# Patient Record
Sex: Male | Born: 1975 | Race: Black or African American | Hispanic: No | Marital: Married | Smoking: Former smoker
Health system: Southern US, Community
[De-identification: ages and names within clinical notes are randomized; demographics above are authoritative.]

## PROBLEM LIST (undated history)

## (undated) DIAGNOSIS — K219 Gastro-esophageal reflux disease without esophagitis: Secondary | ICD-10-CM

---

## 2006-12-22 ENCOUNTER — Emergency Department (HOSPITAL_COMMUNITY): Admission: EM | Admit: 2006-12-22 | Discharge: 2006-12-22 | Payer: Self-pay | Admitting: Emergency Medicine

## 2009-01-25 ENCOUNTER — Emergency Department (HOSPITAL_COMMUNITY): Admission: EM | Admit: 2009-01-25 | Discharge: 2009-01-25 | Payer: Self-pay | Admitting: Family Medicine

## 2009-02-12 ENCOUNTER — Encounter (INDEPENDENT_AMBULATORY_CARE_PROVIDER_SITE_OTHER): Payer: Self-pay | Admitting: *Deleted

## 2009-02-12 ENCOUNTER — Ambulatory Visit: Payer: Self-pay | Admitting: Sports Medicine

## 2009-04-23 ENCOUNTER — Emergency Department (HOSPITAL_COMMUNITY): Admission: EM | Admit: 2009-04-23 | Discharge: 2009-04-23 | Payer: Self-pay | Admitting: Emergency Medicine

## 2010-06-22 ENCOUNTER — Encounter: Payer: Self-pay | Admitting: *Deleted

## 2010-12-18 ENCOUNTER — Inpatient Hospital Stay (INDEPENDENT_AMBULATORY_CARE_PROVIDER_SITE_OTHER)
Admission: RE | Admit: 2010-12-18 | Discharge: 2010-12-18 | Disposition: A | Discharge status: Home / Self Care | Payer: Self-pay | Source: Ambulatory Visit | Attending: Emergency Medicine | Admitting: Emergency Medicine

## 2010-12-18 DIAGNOSIS — H109 Unspecified conjunctivitis: Secondary | ICD-10-CM

## 2013-01-17 ENCOUNTER — Emergency Department (INDEPENDENT_AMBULATORY_CARE_PROVIDER_SITE_OTHER)
Admission: EM | Admit: 2013-01-17 | Discharge: 2013-01-17 | Disposition: A | Discharge status: Home / Self Care | Payer: Self-pay | Source: Home / Self Care | Attending: Emergency Medicine | Admitting: Emergency Medicine

## 2013-01-17 ENCOUNTER — Encounter (HOSPITAL_COMMUNITY): Payer: Self-pay | Admitting: Emergency Medicine

## 2013-01-17 DIAGNOSIS — K529 Noninfective gastroenteritis and colitis, unspecified: Secondary | ICD-10-CM

## 2013-01-17 DIAGNOSIS — K5289 Other specified noninfective gastroenteritis and colitis: Secondary | ICD-10-CM

## 2013-01-17 LAB — POCT I-STAT, CHEM 8
HCT: 45 % (ref 39.0–52.0)
Hemoglobin: 15.3 g/dL (ref 13.0–17.0)
Sodium: 140 mEq/L (ref 135–145)
TCO2: 25 mmol/L (ref 0–100)

## 2013-01-17 MED ORDER — OMEPRAZOLE 20 MG PO CPDR: 20.0000 mg | DELAYED_RELEASE_CAPSULE | Freq: Every day | ORAL | Status: DC

## 2013-01-17 MED ORDER — GI COCKTAIL ~~LOC~~
30.0000 mL | Freq: Once | ORAL | Status: AC
  Administered 2013-01-17: 30 mL via ORAL

## 2013-01-17 MED ORDER — CIPROFLOXACIN HCL 500 MG PO TABS: 500.0000 mg | ORAL_TABLET | Freq: Two times a day (BID) | ORAL | Status: DC

## 2013-01-17 MED ORDER — ONDANSETRON 4 MG PO TBDP
8.0000 mg | ORAL_TABLET | Freq: Once | ORAL | Status: AC
  Administered 2013-01-17: 8 mg via ORAL

## 2013-01-17 MED ORDER — ONDANSETRON 8 MG PO TBDP: 8.0000 mg | ORAL_TABLET | Freq: Three times a day (TID) | ORAL | Status: DC | PRN

## 2013-01-17 MED ORDER — GI COCKTAIL ~~LOC~~
ORAL | Status: AC
  Filled 2013-01-17: qty 30

## 2013-01-17 MED ORDER — ONDANSETRON 4 MG PO TBDP
ORAL_TABLET | ORAL | Status: AC
  Filled 2013-01-17: qty 2

## 2013-01-17 NOTE — ED Notes (Signed)
Pt  Reports   Symptoms  Of   Low  abd pan   With  Decreased  Appetite  Symptoms  Began    Began  About  5  Days  Ago         Had  Some vomiting  Earlier  The vomiting  Stopped   sev  Days  Ago           He  Reports    Decreased  Appetite  As    Well       = he  States  He  Recently retutrned  From [facility]      But  States  He had symptoms  Prior to  Going  There

## 2013-01-17 NOTE — ED Provider Notes (Signed)
Chief Complaint:   Chief Complaint  Patient presents with  . Abdominal Pain    History of Present Illness:   Jamie Day is a 37 year old male who has had a one-week history of nausea. This came on after eating dinner at [employer]. He's also noted anorexia, occasional dry heaves, and occasional crampy abdominal pain. He denies any fever, chills, diarrhea, or blood in the stool. His stools were dark after taking some Pepto-Bismol. He denies any sick exposures or other suspicious ingestions.  Review of Systems:  Other than noted above, the patient denies any of the following symptoms: Systemic:  No fevers, chills, sweats, weight loss or gain, fatigue, or tiredness. ENT:  No nasal congestion, rhinorrhea, or sore throat. Lungs:  No cough, wheezing, or shortness of breath. Cardiac:  No chest pain, syncope, or presyncope. GI:  No abdominal pain, nausea, vomiting, anorexia, diarrhea, constipation, blood in stool or vomitus. GU:  No dysuria, frequency, or urgency.  PMFSH:  Past medical history, family history, social history, meds, and allergies were reviewed.    Physical Exam:   Vital signs:  BP 130/66  Pulse 57  Temp(Src) 98.4 F (36.9 C) (Oral)  Resp 20  SpO2 100% General:  Alert and oriented.  In no distress.  Skin warm and dry.  Good skin turgor, brisk capillary refill. ENT:  No scleral icterus, moist mucous membranes, no oral lesions, pharynx clear. Lungs:  Breath sounds clear and equal bilaterally.  No wheezes, rales, or rhonchi. Heart:  Rhythm regular, without extrasystoles.  No gallops or murmers. Abdomen:  Soft, nontender, no organomegaly or mass, bowel sounds are normally active. Skin: Clear, warm, and dry.  Good turgor.  Brisk capillary refill.  Labs:   Results for orders placed during the hospital encounter of 01/17/13  POCT I-STAT, CHEM 8      Result Value Range   Sodium 140  135 - 145 mEq/L   Potassium 3.4 (*) 3.5 - 5.1 mEq/L   Chloride 104  96 - 112 mEq/L   BUN 9  6  - 23 mg/dL   Creatinine, Ser 1.61  0.50 - 1.35 mg/dL   Glucose, Bld 096 (*) 70 - 99 mg/dL   Calcium, Ion 0.45 (*) 1.12 - 1.23 mmol/L   TCO2 25  0 - 100 mmol/L   Hemoglobin 15.3  13.0 - 17.0 g/dL   HCT 40.9  81.1 - 91.4 %  POCT H PYLORI SCREEN      Result Value Range   H. PYLORI SCREEN, POC NEGATIVE  NEGATIVE     Course in Urgent Care Center:   Given 30 cc of GI cocktail and Zofran ODT 8 mg sublingually. Thereafter he felt a lot better.  Assessment:  The encounter diagnosis was Gastroenteritis.  Could be food poisoning. Symptoms are consistent with viral versus bacterial gastroenteritis, gastritis, ulcer disease.  Plan:   1.  Meds:  The following meds were prescribed:   Discharge Medication List as of 01/17/2013  5:21 PM    START taking these medications   Details  ciprofloxacin (CIPRO) 500 MG tablet Take 1 tablet (500 mg total) by mouth every 12 (twelve) hours., Starting 01/17/2013, Until Discontinued, Normal    omeprazole (PRILOSEC) 20 MG capsule Take 1 capsule (20 mg total) by mouth daily., Starting 01/17/2013, Until Discontinued, Normal    ondansetron (ZOFRAN ODT) 8 MG disintegrating tablet Take 1 tablet (8 mg total) by mouth every 8 (eight) hours as needed for nausea., Starting 01/17/2013, Until Discontinued, Normal  2.  Patient Education/Counseling:  The patient was given appropriate handouts, self care instructions, and instructed in symptomatic relief. The patient was told to stay on clear liquids for the remainder of the day, then advance to a B.R.A.T. diet starting tomorrow.   3.  Follow up:  The patient was told to follow up if no better in 3 to 4 days, if becoming worse in any way, and given some red flag symptoms such as fever, persistent vomiting, or increasing pain which would prompt immediate return.  Follow up here if necessary.       Reuben Likes, MD 01/17/13 2226

## 2013-02-19 ENCOUNTER — Ambulatory Visit
Admission: RE | Admit: 2013-02-19 | Discharge: 2013-02-19 | Disposition: A | Discharge status: Home / Self Care | Payer: No Typology Code available for payment source | Source: Ambulatory Visit | Attending: Infectious Disease | Admitting: Infectious Disease

## 2013-02-19 ENCOUNTER — Other Ambulatory Visit: Payer: Self-pay | Admitting: Infectious Disease

## 2013-02-19 DIAGNOSIS — A150 Tuberculosis of lung: Secondary | ICD-10-CM

## 2014-08-19 ENCOUNTER — Emergency Department (INDEPENDENT_AMBULATORY_CARE_PROVIDER_SITE_OTHER)
Admission: EM | Admit: 2014-08-19 | Discharge: 2014-08-19 | Disposition: A | Discharge status: Home / Self Care | Payer: Self-pay | Source: Home / Self Care | Attending: Family Medicine | Admitting: Family Medicine

## 2014-08-19 ENCOUNTER — Encounter (HOSPITAL_COMMUNITY): Payer: Self-pay | Admitting: Emergency Medicine

## 2014-08-19 ENCOUNTER — Emergency Department (INDEPENDENT_AMBULATORY_CARE_PROVIDER_SITE_OTHER): Payer: Self-pay

## 2014-08-19 DIAGNOSIS — M238X2 Other internal derangements of left knee: Secondary | ICD-10-CM

## 2014-08-19 DIAGNOSIS — M25562 Pain in left knee: Secondary | ICD-10-CM

## 2014-08-19 NOTE — ED Notes (Signed)
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

## 2014-08-19 NOTE — Discharge Instructions (Signed)
Knee Pain Ice. Limit activity Call orhtopedist for appointment Ibuprofen for pain The knee is the complex joint between your thigh and your lower leg. It is made up of bones, tendons, ligaments, and cartilage. The bones that make up the knee are:  The femur in the thigh.  The tibia and fibula in the lower leg.  The patella or kneecap riding in the groove on the lower femur. CAUSES  Knee pain is a common complaint with many causes. A few of these causes are:  Injury, such as:  A ruptured ligament or tendon injury.  Torn cartilage.  Medical conditions, such as:  Gout  Arthritis  Infections  Overuse, over training, or overdoing a physical activity. Knee pain can be minor or severe. Knee pain can accompany debilitating injury. Minor knee problems often respond well to self-care measures or get well on their own. More serious injuries may need medical intervention or even surgery. SYMPTOMS The knee is complex. Symptoms of knee problems can vary widely. Some of the problems are:  Pain with movement and weight bearing.  Swelling and tenderness.  Buckling of the knee.  Inability to straighten or extend your knee.  Your knee locks and you cannot straighten it.  Warmth and redness with pain and fever.  Deformity or dislocation of the kneecap. DIAGNOSIS  Determining what is wrong may be very straight forward such as when there is an injury. It can also be challenging because of the complexity of the knee. Tests to make a diagnosis may include:  Your caregiver taking a history and doing a physical exam.  Routine X-rays can be used to rule out other problems. X-rays will not reveal a cartilage tear. Some injuries of the knee can be diagnosed by:  Arthroscopy a surgical technique by which a small video camera is inserted through tiny incisions on the sides of the knee. This procedure is used to examine and repair internal knee joint problems. Tiny instruments can be used during  arthroscopy to repair the torn knee cartilage (meniscus).  Arthrography is a radiology technique. A contrast liquid is directly injected into the knee joint. Internal structures of the knee joint then become visible on X-ray film.  An MRI scan is a non X-ray radiology procedure in which magnetic fields and a computer produce two- or three-dimensional images of the inside of the knee. Cartilage tears are often visible using an MRI scanner. MRI scans have largely replaced arthrography in diagnosing cartilage tears of the knee.  Blood work.  Examination of the fluid that helps to lubricate the knee joint (synovial fluid). This is done by taking a sample out using a needle and a syringe. TREATMENT The treatment of knee problems depends on the cause. Some of these treatments are:  Depending on the injury, proper casting, splinting, surgery, or physical therapy care will be needed.  Give yourself adequate recovery time. Do not overuse your joints. If you begin to get sore during workout routines, back off. Slow down or do fewer repetitions.  For repetitive activities such as cycling or running, maintain your strength and nutrition.  Alternate muscle groups. For example, if you are a weight lifter, work the upper body on one day and the lower body the next.  Either tight or weak muscles do not give the proper support for your knee. Tight or weak muscles do not absorb the stress placed on the knee joint. Keep the muscles surrounding the knee strong.  Take care of mechanical problems.  If  you have flat feet, orthotics or special shoes may help. See your caregiver if you need help.  Arch supports, sometimes with wedges on the inner or outer aspect of the heel, can help. These can shift pressure away from the side of the knee most bothered by osteoarthritis.  A brace called an "unloader" brace also may be used to help ease the pressure on the most arthritic side of the knee.  If your caregiver has  prescribed crutches, braces, wraps or ice, use as directed. The acronym for this is PRICE. This means protection, rest, ice, compression, and elevation.  Nonsteroidal anti-inflammatory drugs (NSAIDs), can help relieve pain. But if taken immediately after an injury, they may actually increase swelling. Take NSAIDs with food in your stomach. Stop them if you develop stomach problems. Do not take these if you have a history of ulcers, stomach pain, or bleeding from the bowel. Do not take without your caregiver's approval if you have problems with fluid retention, heart failure, or kidney problems.  For ongoing knee problems, physical therapy may be helpful.  Glucosamine and chondroitin are over-the-counter dietary supplements. Both may help relieve the pain of osteoarthritis in the knee. These medicines are different from the usual anti-inflammatory drugs. Glucosamine may decrease the rate of cartilage destruction.  Injections of a corticosteroid drug into your knee joint may help reduce the symptoms of an arthritis flare-up. They may provide pain relief that lasts a few months. You may have to wait a few months between injections. The injections do have a small increased risk of infection, water retention, and elevated blood sugar levels.  Hyaluronic acid injected into damaged joints may ease pain and provide lubrication. These injections may work by reducing inflammation. A series of shots may give relief for as long as 6 months.  Topical painkillers. Applying certain ointments to your skin may help relieve the pain and stiffness of osteoarthritis. Ask your pharmacist for suggestions. Many over the-counter products are approved for temporary relief of arthritis pain.  In some countries, doctors often prescribe topical NSAIDs for relief of chronic conditions such as arthritis and tendinitis. A review of treatment with NSAID creams found that they worked as well as oral medications but without the serious  side effects. PREVENTION  Maintain a healthy weight. Extra pounds put more strain on your joints.  Get strong, stay limber. Weak muscles are a common cause of knee injuries. Stretching is important. Include flexibility exercises in your workouts.  Be smart about exercise. If you have osteoarthritis, chronic knee pain or recurring injuries, you may need to change the way you exercise. This does not mean you have to stop being active. If your knees ache after jogging or playing basketball, consider switching to swimming, water aerobics, or other low-impact activities, at least for a few days a week. Sometimes limiting high-impact activities will provide relief.  Make sure your shoes fit well. Choose footwear that is right for your sport.  Protect your knees. Use the proper gear for knee-sensitive activities. Use kneepads when playing volleyball or laying carpet. Buckle your seat belt every time you drive. Most shattered kneecaps occur in car accidents.  Rest when you are tired. SEEK MEDICAL CARE IF:  You have knee pain that is continual and does not seem to be getting better.  SEEK IMMEDIATE MEDICAL CARE IF:  Your knee joint feels hot to the touch and you have a high fever. MAKE SURE YOU:   Understand these instructions.  Will watch your condition.  Will get help right away if you are not doing well or get worse. Document Released: 01/24/2007 Document Revised: 06/21/2011 Document Reviewed: 01/24/2007 [facility] Patient Information 2015 [place], [place]. This information is not intended to replace advice given to you by your health care provider. Make sure you discuss any questions you have with your health care provider.

## 2014-08-19 NOTE — ED Provider Notes (Signed)
CSN: [id number]     Arrival date & time 08/19/14  1207 History   First MD Initiated Contact with Patient 08/19/14 1456     Chief Complaint  Patient presents with  . Knee Pain   (Consider location/radiation/quality/duration/timing/severity/associated sxs/prior Treatment) HPI Comments: 39 year old male presents with complaints of left knee pain for approximately 3 months. He states it occurs on a daily basis. The pain is not necessarily worse with activity and he is able to ambulate well, however, after ambulating or playing basketball sometimes there is more swelling or pain to the lateral aspect of the left knee. He generally applies ice to the area which helps significantly. He states there is no new pain or problems today. It has only been getting worse over a period of a few weeks. No known injury or trauma.   History reviewed. No pertinent past medical history. History reviewed. No pertinent past surgical history. History reviewed. No pertinent family history. History  Substance Use Topics  . Smoking status: Never Smoker   . Smokeless tobacco: Not on file  . Alcohol Use: No    Review of Systems  Constitutional: Negative.   HENT: Negative.   Respiratory: Negative.   Gastrointestinal: Negative.   Genitourinary: Negative.   Musculoskeletal: Positive for joint swelling. Negative for back pain.  Skin: Negative.   Neurological: Negative.     Allergies  Review of patient's allergies indicates no known allergies.  Home Medications   Prior to Admission medications   Medication Sig Start Date End Date Taking? Authorizing Provider  ciprofloxacin (CIPRO) 500 MG tablet Take 1 tablet (500 mg total) by mouth every 12 (twelve) hours. 01/17/13   Reuben Likesavid C Keller, MD  diclofenac sodium (VOLTAREN) 1 % GEL Apply topically. 1 gram into the affected area QID     Historical Provider, MD  omeprazole (PRILOSEC) 20 MG capsule Take 1 capsule (20 mg total) by mouth daily. 01/17/13   Reuben Likesavid C Keller, MD   ondansetron (ZOFRAN ODT) 8 MG disintegrating tablet Take 1 tablet (8 mg total) by mouth every 8 (eight) hours as needed for nausea. 01/17/13   Reuben Likesavid C Keller, MD   BP 108/54 mmHg  Pulse 50  Temp(Src) 98.7 F (37.1 C) (Oral)  Resp 16  SpO2 99% Physical Exam  Constitutional: He is oriented to person, place, and time. He appears well-developed and well-nourished. No distress.  Pulmonary/Chest: Effort normal. No respiratory distress.  Musculoskeletal: Normal range of motion. He exhibits tenderness.  There is a small area of swelling over the left lateral knee. He has full flexion and extension. He is able to bear weight without pain. Palpation of the knee reveals no tenderness except over the area of swelling on the lateral aspect. Negative drawer. Negative varus. Valgus force produces mild pain to the lateral aspect of the knee. No laxity appreciated.  Neurological: He is alert and oriented to person, place, and time.  Skin: Skin is warm and dry.  Nursing note and vitals reviewed.   ED Course  Procedures (including critical care time) Labs Review Labs Reviewed - No data to display  Imaging Review Dg Knee Complete 4 Views Left  08/19/2014   CLINICAL DATA:  Chronic left knee pain with worsening in 3 months  EXAM: LEFT KNEE - COMPLETE 4+ VIEW  COMPARISON:  None.  FINDINGS: Four views of the left knee submitted. No acute fracture or subluxation. Mild narrowing of patellofemoral joint space. There is well corticated calcification posteromedially just above the tibial plateau suspicious for intra-articular  loose body. Measures about 9 mm.  IMPRESSION: No acute fracture or subluxation. Mild narrowing of patellofemoral joint space. There is well corticated calcification posteromedially just above the tibial plateau suspicious for intra-articular loose body. Measures about 9 mm.   Electronically Signed   By: Natasha MeadLiviu  Pop M.D.   On: 08/19/2014 15:33     MDM   1. Left lateral knee pain   2. 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 Must follow-up with orthopedist.  Ice. Limit activity Call orhtopedist for appointment Ibuprofen for pain   Hayden Rasmussenavid Jaquavion Mccannon, NP 08/19/14 1549

## 2015-12-16 ENCOUNTER — Ambulatory Visit (INDEPENDENT_AMBULATORY_CARE_PROVIDER_SITE_OTHER): Payer: Self-pay | Admitting: Physician Assistant

## 2015-12-16 VITALS — BP 122/72 | HR 71 | Temp 98.2°F | Resp 17 | Ht 76.5 in | Wt 193.0 lb

## 2015-12-16 DIAGNOSIS — Z021 Encounter for pre-employment examination: Secondary | ICD-10-CM

## 2015-12-16 DIAGNOSIS — Z024 Encounter for examination for driving license: Secondary | ICD-10-CM

## 2015-12-16 NOTE — Patient Instructions (Addendum)
  Baby shampoo - johnson and johnson no tear shampoo -- warm compresses   IF you received an x-ray today, you will receive an invoice from [facility]. Please contact [facility] at [phone] with questions or concerns regarding your invoice.   IF you received labwork today, you will receive an invoice from [facility]. Please contact [facility] at [phone] with questions or concerns regarding your invoice.   Our billing staff will not be able to assist you with questions regarding bills from these companies.  You will be contacted with the lab results as soon as they are available. The fastest way to get your results is to activate your My Chart account. Instructions are located on the last page of this paperwork. If you have not heard from us regarding the results in 2 weeks, please contact this office.

## 2015-12-16 NOTE — Progress Notes (Signed)
This patient presents for DOT examination for fitness for duty.  He has never had a DOT in the past.  Medical History:  no  1. Head/Brain Injuries, disorders or illnesses No  2. . Seizures, epilepsy no  3. Eye disorders or impaired vision (except corrective lenses) - wears glasses no  4. Ear disorders, loss of hearing or balance no  5. Heart disease or heart attack, other cardiovascular condition no  6. Heart surgery (valve replacement/bypass, angioplasty, pacemaker/defribrillator) no  7. High blood pressure no  8. High holesterol no  9. Chronic cough, shortness of breath or other breathing problems no  10. Lung disease (emphysema, asthma or chronic bronchitis) no  11. Kidney disease, dialysis no  12. Digestive problems  no  13. Diabetes or elevated blood sugar  no  Insulin use no  14. Nervous or psychiatric disorders, e.g., severe depression no  15. Fainting or syncope no  16. Dizziness, headaches, numbness, tingling or memory loss no 17. Unexplained weight loss no  18. Stroke, TIA or paralysis no 19. Missing or impaired hand, arm, foot, leg, finger, toe no  20. Spinal injury or disease no  21. Bone, muscles or nerve problems no  22. Blood clots or bleeding bleeding disorders no  23. Cancer no  24. Chronic infection or other chronic diseases no  25. Sleep disorders, pauses in breathing while asleep, daytime sleepiness, loud snoring no  26. Have you ever had a sleep test? no  27.  Have you ever spent a night in the hospital? no  28. Have you ever had a broken bone? no  29. Have you or or do you use tobacco products? no  30. Regular, frequent alcohol use no  31. Illegal substance use within the past 2 years no  32.  Have you ever failed a drug test or been dependent on an illegal substance?  Current Medications: Prior to Admission medications   Not on File    Medical Examiner's Comments on Health History:  Healthy - has a current stye which is distracting to his  vision  TESTING:   Visual Acuity Screening   Right eye Left eye Both eyes  Without correction:     With correction: 20/40 20/25 20/25   Hearing Screening Comments: Whisper test was 79ft in both ears.  Monocular Vision: No.  Hearing Aid used for test: No. Hearing Aid required to to meet standard: No.  BP 122/72 (BP Location: Right Arm, Patient Position: Sitting, Cuff Size: Normal)   Pulse 71   Temp 98.2 F (36.8 C) (Oral)   Resp 17   Ht 6' 4.5" (1.943 m)   Wt 193 lb (87.5 kg)   SpO2 98%   BMI 23.19 kg/m  Pulse rate is regular     PHYSICAL EXAMINATION:  1. No. General Appearance: Marked overweight, tremor, signs of alcoholism, problem drinking or drug abuse. 2. No. Skin Exam - tattoos, scars 3. No. Eyes: pupillary equality, reaction to light, accommodation, ocular motility, ocular muscle imbalance, extra ocular movement, nystagmus, exopthalmos. Ask about retinopathy, cataracts, aphakia, glaucoma, macular degeneration and refer to a specialist if appropriate. -- pt has a left upper eyelid stye 4. No. Ears: Scarring of tympanic membrane, occlusion of external canal, perforated eardrums.     5. No. Mouth and Throat: Irremedial deformities likely to interfere with breathing or swallowing.    6. No. Heart: Murmurs, extra sounds, enlarged heart, pacemaker, implantable defibrillator.     7. No. Lungs and Chest, not including breast examination: Abnormal  Chest wall expansion, abnormal respiratory rate, abnormal breath sounds including wheezes or alveolar rates, impaired respiratory function, cyanosis. Abnormal findings on physical exam may require further testing such as pulmonary tests and/or x ray of chest.  8. No. Abdomen and Viscera: Enlarged liver, enlarged spleen, masses, bruits, hernia, significant abdominal wall muscle weakness.  9. No. Genitourinary System: Hernia  10. No. Spine, other musculoskeletal: Previous surgery, deformities, limitation of motion, tenderness. 11.  No. Extremities-Limb impaired: Loss or impairment of leg, foot, toe, arm, hand, finger. Perceptible limp, deformities, atrophy, weakness, paralysis, clubbing, edema, hypotonia. Insufficient grasp and prehension to maintain steering wheel grip. Insufficient mobility and strength in lower limb to operate pedals properly. 12. No. Neurological: Impaired equilibrium, coordination or speech pattern; paresthesia, asymmetric deep tendon reflexes, sensory or positional abnormalities, abnormal patellar and Babinski's reflexes 13. No. Gait - antalgic, ataxia  14. No. Vascular System: Abnormal pulse and amplitude, carotid or arterial bruits, varicose veins.   Does not meet standards. Certification Status: does meet standards for 2 year certificate.  Wearing corrective lenses: yes Wearing hearing aid: no Accompanied by a no waiver/exemption   Certification expires 12/15/2017  Jamie LennertSarah Olivya Sobol PA-C  [facility] 12/16/2015 11:56 AM

## 2016-11-25 ENCOUNTER — Other Ambulatory Visit: Payer: Self-pay | Admitting: Internal Medicine

## 2016-11-25 DIAGNOSIS — R109 Unspecified abdominal pain: Secondary | ICD-10-CM

## 2016-11-25 DIAGNOSIS — R110 Nausea: Secondary | ICD-10-CM

## 2016-11-26 ENCOUNTER — Other Ambulatory Visit: Payer: Self-pay | Admitting: Internal Medicine

## 2016-11-26 DIAGNOSIS — R110 Nausea: Secondary | ICD-10-CM

## 2016-11-26 DIAGNOSIS — R109 Unspecified abdominal pain: Secondary | ICD-10-CM

## 2016-11-30 ENCOUNTER — Other Ambulatory Visit: Payer: Self-pay

## 2016-12-28 DIAGNOSIS — R634 Abnormal weight loss: Secondary | ICD-10-CM | POA: Diagnosis not present

## 2016-12-28 DIAGNOSIS — K30 Functional dyspepsia: Secondary | ICD-10-CM | POA: Diagnosis not present

## 2016-12-28 DIAGNOSIS — Z0000 Encounter for general adult medical examination without abnormal findings: Secondary | ICD-10-CM | POA: Diagnosis not present

## 2016-12-28 DIAGNOSIS — K219 Gastro-esophageal reflux disease without esophagitis: Secondary | ICD-10-CM | POA: Diagnosis not present

## 2016-12-28 DIAGNOSIS — E7800 Pure hypercholesterolemia, unspecified: Secondary | ICD-10-CM | POA: Diagnosis not present

## 2016-12-28 DIAGNOSIS — Z125 Encounter for screening for malignant neoplasm of prostate: Secondary | ICD-10-CM | POA: Diagnosis not present

## 2016-12-28 DIAGNOSIS — Z112 Encounter for screening for other bacterial diseases: Secondary | ICD-10-CM | POA: Diagnosis not present

## 2016-12-29 DIAGNOSIS — Z0000 Encounter for general adult medical examination without abnormal findings: Secondary | ICD-10-CM | POA: Diagnosis not present

## 2016-12-29 DIAGNOSIS — R319 Hematuria, unspecified: Secondary | ICD-10-CM | POA: Diagnosis not present

## 2016-12-29 DIAGNOSIS — N390 Urinary tract infection, site not specified: Secondary | ICD-10-CM | POA: Diagnosis not present

## 2017-01-05 DIAGNOSIS — K219 Gastro-esophageal reflux disease without esophagitis: Secondary | ICD-10-CM | POA: Diagnosis not present

## 2017-01-05 DIAGNOSIS — R1033 Periumbilical pain: Secondary | ICD-10-CM | POA: Diagnosis not present

## 2017-01-31 ENCOUNTER — Encounter (HOSPITAL_COMMUNITY): Payer: Self-pay | Admitting: Emergency Medicine

## 2017-01-31 ENCOUNTER — Ambulatory Visit (HOSPITAL_COMMUNITY)
Admission: EM | Admit: 2017-01-31 | Discharge: 2017-01-31 | Disposition: A | Discharge status: Home / Self Care | Payer: Self-pay

## 2017-01-31 DIAGNOSIS — S39012A Strain of muscle, fascia and tendon of lower back, initial encounter: Secondary | ICD-10-CM

## 2017-01-31 DIAGNOSIS — M5489 Other dorsalgia: Secondary | ICD-10-CM

## 2017-01-31 HISTORY — DX: Gastro-esophageal reflux disease without esophagitis: K21.9

## 2017-01-31 MED ORDER — LIDOCAINE 5 % EX PTCH: 1.0000 | MEDICATED_PATCH | CUTANEOUS | 0 refills | Status: DC
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
## 2017-01-31 NOTE — Discharge Instructions (Signed)
Your exam was most consistent with muscle strain. Start lidoderm patches as directed. Tylenol for pain as needed. Ice/heat compresses as needed. This can take up to 3-4 weeks to completely resolve, but you should be feeling better each week. Follow up here or with PCP if symptoms worsen, changes for reevaluation. If experience numbness/tingling of the inner thighs, loss of bladder or bowel control, go to the emergency department for evaluation.
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
## 2017-07-30 ENCOUNTER — Emergency Department (HOSPITAL_COMMUNITY): Payer: BLUE CROSS/BLUE SHIELD

## 2017-07-30 ENCOUNTER — Encounter (HOSPITAL_COMMUNITY): Payer: Self-pay | Admitting: Emergency Medicine

## 2017-07-30 ENCOUNTER — Ambulatory Visit (HOSPITAL_COMMUNITY)
Admission: EM | Admit: 2017-07-30 | Discharge: 2017-07-30 | Disposition: A | Discharge status: Home / Self Care | Payer: BLUE CROSS/BLUE SHIELD | Source: Home / Self Care

## 2017-07-30 ENCOUNTER — Emergency Department (HOSPITAL_COMMUNITY)
Admission: EM | Admit: 2017-07-30 | Discharge: 2017-07-30 | Disposition: A | Discharge status: Home / Self Care | Payer: BLUE CROSS/BLUE SHIELD | Attending: Physician Assistant | Admitting: Physician Assistant

## 2017-07-30 ENCOUNTER — Other Ambulatory Visit: Payer: Self-pay

## 2017-07-30 DIAGNOSIS — Z79899 Other long term (current) drug therapy: Secondary | ICD-10-CM | POA: Insufficient documentation

## 2017-07-30 DIAGNOSIS — Y999 Unspecified external cause status: Secondary | ICD-10-CM | POA: Insufficient documentation

## 2017-07-30 DIAGNOSIS — M545 Low back pain: Secondary | ICD-10-CM | POA: Insufficient documentation

## 2017-07-30 DIAGNOSIS — S3982XA Other specified injuries of lower back, initial encounter: Secondary | ICD-10-CM | POA: Diagnosis not present

## 2017-07-30 DIAGNOSIS — Y92009 Unspecified place in unspecified non-institutional (private) residence as the place of occurrence of the external cause: Secondary | ICD-10-CM | POA: Insufficient documentation

## 2017-07-30 DIAGNOSIS — Z23 Encounter for immunization: Secondary | ICD-10-CM | POA: Insufficient documentation

## 2017-07-30 DIAGNOSIS — W01190A Fall on same level from slipping, tripping and stumbling with subsequent striking against furniture, initial encounter: Secondary | ICD-10-CM | POA: Diagnosis not present

## 2017-07-30 DIAGNOSIS — W1789XA Other fall from one level to another, initial encounter: Secondary | ICD-10-CM | POA: Diagnosis not present

## 2017-07-30 DIAGNOSIS — W19XXXA Unspecified fall, initial encounter: Secondary | ICD-10-CM

## 2017-07-30 DIAGNOSIS — S51031A Puncture wound without foreign body of right elbow, initial encounter: Secondary | ICD-10-CM | POA: Diagnosis not present

## 2017-07-30 DIAGNOSIS — M25532 Pain in left wrist: Secondary | ICD-10-CM | POA: Diagnosis not present

## 2017-07-30 DIAGNOSIS — S51001A Unspecified open wound of right elbow, initial encounter: Secondary | ICD-10-CM | POA: Diagnosis not present

## 2017-07-30 DIAGNOSIS — S6992XA Unspecified injury of left wrist, hand and finger(s), initial encounter: Secondary | ICD-10-CM | POA: Diagnosis not present

## 2017-07-30 DIAGNOSIS — Y9389 Activity, other specified: Secondary | ICD-10-CM | POA: Insufficient documentation

## 2017-07-30 DIAGNOSIS — S62014D Nondisplaced fracture of distal pole of navicular [scaphoid] bone of right wrist, subsequent encounter for fracture with routine healing: Secondary | ICD-10-CM | POA: Diagnosis not present

## 2017-07-30 DIAGNOSIS — S92155A Nondisplaced avulsion fracture (chip fracture) of left talus, initial encounter for closed fracture: Secondary | ICD-10-CM | POA: Diagnosis not present

## 2017-07-30 DIAGNOSIS — M25521 Pain in right elbow: Secondary | ICD-10-CM | POA: Diagnosis not present

## 2017-07-30 MED ORDER — IBUPROFEN 800 MG PO TABS: 800.0000 mg | ORAL_TABLET | Freq: Three times a day (TID) | ORAL | 0 refills | Status: DC

## 2017-07-30 MED ORDER — TETANUS-DIPHTH-ACELL PERTUSSIS 5-2.5-18.5 LF-MCG/0.5 IM SUSP
0.5000 mL | Freq: Once | INTRAMUSCULAR | Status: AC
  Administered 2017-07-30: 0.5 mL via INTRAMUSCULAR
  Filled 2017-07-30: qty 0.5

## 2017-07-30 MED ORDER — CYCLOBENZAPRINE HCL 10 MG PO TABS: 10.0000 mg | ORAL_TABLET | Freq: Two times a day (BID) | ORAL | 0 refills | Status: DC | PRN

## 2017-07-30 MED ORDER — OXYCODONE-ACETAMINOPHEN 5-325 MG PO TABS
1.0000 | ORAL_TABLET | Freq: Once | ORAL | Status: AC
  Administered 2017-07-30: 1 via ORAL
  Filled 2017-07-30: qty 1

## 2017-07-30 MED ORDER — PROMETHAZINE HCL 25 MG PO TABS
12.5000 mg | ORAL_TABLET | Freq: Once | ORAL | Status: AC
  Administered 2017-07-30: 12.5 mg via ORAL
  Filled 2017-07-30: qty 1

## 2017-07-30 MED ORDER — OXYCODONE-ACETAMINOPHEN 5-325 MG PO TABS: 1.0000 | ORAL_TABLET | Freq: Four times a day (QID) | ORAL | 0 refills | Status: DC | PRN

## 2017-07-30 MED ORDER — PANTOPRAZOLE SODIUM 40 MG PO TBEC
40.0000 mg | DELAYED_RELEASE_TABLET | Freq: Every day | ORAL | Status: DC
  Administered 2017-07-30: 40 mg via ORAL
  Filled 2017-07-30: qty 1

## 2017-07-30 NOTE — ED Notes (Signed)
Patient transported to CT 

## 2017-07-30 NOTE — ED Triage Notes (Signed)
Per pt and S.O.: pt was walking in attic above garage when he accidentally fell through approx 12 ft onto the garage floor.  Pt denies LOC, but unwitnessed fall.  Pt c/o significant back pain, multiple abrasions.  Discussed with Marilynn RailN. Burky, NP - recommended pt go to ED.  Pt and SO verbalized understanding.

## 2017-07-30 NOTE — ED Provider Notes (Addendum)
[facility]   CSN: [id number] Arrival date & time: 07/30/17  1424     History   Chief Complaint Chief Complaint  Patient presents with  . Fall    HPI Jamie Day is a 42 y.o. male.  HPI   42 year old male presenting after a fall.  Patient was walking around in the attic and was not walking on the beams, walked on the plywood which broke through the ceiling onto his concrete garage.  Patient had no LOC.  Patient did have his left first digit dislocated which he really reduced himself.  Patient has not had any symptoms such as nausea vomiting.  His only pain is located in the left wrist and right elbow and lower back.  Patient has a punctate lesion on the right elbow.  Past Medical History:  Diagnosis Date  . Acid reflux     There are no active problems to display for this patient.   History reviewed. No pertinent surgical history.      Home Medications    Prior to Admission medications   Medication Sig Start Date End Date Taking? Authorizing Provider  cetirizine (ZYRTEC) 10 MG tablet Take 10 mg by mouth daily.   Yes [provider]  Cholecalciferol (VITAMIN D-3) 5000 units TABS Take 5,000 Units by mouth daily.   Yes [provider]  Cranberry 300 MG tablet Take 300 mg by mouth daily.   Yes [provider]  esomeprazole (NEXIUM) 20 MG capsule Take 20 mg by mouth daily.   Yes [provider]  fluticasone (FLONASE) 50 MCG/ACT nasal spray Place 2 sprays into both nostrils daily.   Yes [provider]  Peppermint Oil (IBGARD) 90 MG CPCR Take 90 mg by mouth daily.   Yes [provider]  promethazine (PHENERGAN) 25 MG tablet Take 25 mg by mouth every 6 (six) hours as needed for nausea or vomiting.   Yes [provider]  cyclobenzaprine (FLEXERIL) 10 MG tablet Take 1 tablet (10 mg total) by mouth 2 (two) times daily as needed for muscle spasms. 07/30/17    Mackuen, Courteney Lyn, MD  ibuprofen (ADVIL,MOTRIN) 800 MG tablet Take 1 tablet (800 mg total) by mouth 3 (three) times daily. 07/30/17   Mackuen, Courteney Lyn, MD  lidocaine (LIDODERM) 5 % Place 1 patch onto the skin daily. Remove & Discard patch within 12 hours or as directed by MD Patient not taking: Reported on 07/30/2017 01/31/17   Belinda FisherYu, Amy V, PA-C  oxyCODONE-acetaminophen (PERCOCET/ROXICET) 5-325 MG tablet Take 1 tablet by mouth every 6 (six) hours as needed for severe pain. 07/30/17   Mackuen, Cindee Saltourteney Lyn, MD    Family History History reviewed. No pertinent family history.  Social History Social History   Tobacco Use  . Smoking status: Never Smoker  . Smokeless tobacco: Never Used  Substance Use Topics  . Alcohol use: No  . Drug use: No     Allergies   Patient has no known allergies.   Review of Systems Review of Systems  Constitutional: Negative for activity change.  Respiratory: Negative for shortness of breath.   Cardiovascular: Negative for chest pain.  Gastrointestinal: Negative for abdominal pain.  Neurological: Negative for dizziness and headaches.  All other systems reviewed and are negative.    Physical Exam Updated Vital Signs BP 125/76 (BP Location: Right Arm)   Pulse 79   Temp 98.8 F (37.1 C) (Oral)   Resp 18   SpO2 99%  Physical Exam  Constitutional: He is oriented to person, place, and time. He appears well-nourished.  HENT:  Head: Normocephalic.  Eyes: Conjunctivae are normal. Right eye exhibits no discharge. Left eye exhibits no discharge.  Cardiovascular: Normal rate and regular rhythm.  Pulmonary/Chest: Effort normal and breath sounds normal. No respiratory distress.  Abdominal: Soft. Bowel sounds are normal. He exhibits no distension. There is no tenderness.  Musculoskeletal:  Left wrist with swelling  Right elbow with < 1 cm puncture.   Neurological: He is alert and oriented to person, place, and time. No cranial nerve deficit.  Coordination normal.  No numbness or tingling.   Skin: Skin is warm and dry. He is not diaphoretic.  Psychiatric: He has a normal mood and affect. His behavior is normal.     ED Treatments / Results  Labs (all labs ordered are listed, but only abnormal results are displayed) Labs Reviewed - No data to display  EKG None  Radiology Dg Lumbar Spine Complete  Result Date: 07/30/2017 CLINICAL DATA:  Fall through the attic floor. EXAM: LUMBAR SPINE - COMPLETE 4+ VIEW COMPARISON:  None. FINDINGS: Degenerative disc disease throughout the lumbar spine, most pronounced at L4-5 and L5-S1 with disc space narrowing and spurring. Degenerative facet disease from L3-4 through L5-S1. 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 No acute bony abnormality. No acute fracture. SI joints are symmetric and unremarkable. IMPRESSION: Degenerative disc and facet disease. 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 Grade 1 anterolisthesis of L5 on S1. No acute bony abnormality. Electronically Signed   By: Charlett Nose M.D.   On: 07/30/2017 16:33   Dg Elbow Complete Right  Result Date: 07/30/2017 CLINICAL DATA:  Patient presents to ED for assessment after falling through the soft area of his attic on to the concrete garage floor. Patient denies LOC, denies head injury. Left arm excoriation marks noted, right elbow has an avulsion with bleeding. EXAM: RIGHT ELBOW - COMPLETE 3+ VIEW COMPARISON:  None. FINDINGS: There are well corticated degenerative changes at the coronoid process. No evidence for acute fracture, subluxation, or joint effusion. IMPRESSION: No evidence for acute  abnormality. Electronically Signed   By: Norva Pavlov M.D.   On: 07/30/2017 15:19   Dg Wrist Complete Left  Result Date: 07/30/2017 CLINICAL DATA:  Patient presents to ED for assessment after falling through the soft area of his attic on to the concrete garage floor. Patient denies LOC, denies head injury. Left arm excoriation marks noted, right elbow has an avulsion  with bleeding controlled. EXAM: LEFT WRIST - COMPLETE 3+ VIEW COMPARISON:  None. FINDINGS: There is increased scapholunate distance. A corticated bone density is identified adjacent to the scaphoid, and the findings favor remote injury. No definite acute fracture. Intercarpal spaces are normal. IMPRESSION: 1. Increased scapholunate distance and remote avulsion fracture in the region of the radioscaphoid joint. Suspect all the findings are chronic. However, correlation is recommended with physical exam to assess acuity, given the lack of prior comparison films. 2. Recommend scaphoid view for further evaluation. Electronically Signed   By: Norva Pavlov M.D.   On: 07/30/2017 15:18   Dg Wrist Navic Only Left  Result Date: 07/30/2017 CLINICAL DATA:  Widened scapholunate distance and old avulsion fracture fragment in the region of the radioscaphoid joint. A navicular view was recommended since there were no prior comparisons. Fell through his attic floor onto the concrete floor of his garage. EXAM: DG WRIST NAVIC ONLY LEFT COMPARISON:  Left wrist radiographs obtained earlier today. FINDINGS: Again demonstrated is a  corticated bone fragment lateral to the scaphoid and distal to the radial styloid. There is corresponding irregularity of the distal aspect of the scaphoid laterally as well as a small spur arising from the lateral aspect of the radial styloid. No acute fracture lines are seen. The widened scapholunate distance is not as evident on these images. IMPRESSION: Old fracture of the distal scaphoid with a proximally displaced fragment and secondary degenerative changes. No acute fracture. Electronically Signed   By: Beckie Salts M.D.   On: 07/30/2017 16:05   Ct Wrist Left Wo Contrast  Result Date: 07/30/2017 CLINICAL DATA:  Recent fall with wrist pain and findings of prior fractures on recent plain films EXAM: CT OF THE LEFT WRIST WITHOUT CONTRAST TECHNIQUE: Multidetector CT imaging was performed  according to the standard protocol. Multiplanar CT image reconstructions were also generated. COMPARISON:  Plain films from earlier in the same day. FINDINGS: Bones/Joint/Cartilage Well corticated bony densities are noted adjacent to the distal radius laterally consistent with prior fracture and nonunion. No acute radial fracture is seen. Some degenerative changes of the radiocarpal joint are seen. No distal ulnar fracture is noted. Mild widening of the scapholunate articulation is noted likely related to prior ligamentous injury. There are multiple small fragments identified along the posterior carpal bones. These are all well corticated and consistent with prior fractures with nonunion. Irregularity of the distal scaphoid is noted similar to that seen on prior plain film examination also consistent with prior fracture and nonunion. No acute fracture is identified. Ligaments Suboptimally assessed by CT. Muscles and Tendons No acute abnormality is identified on this exam although muscle and tendon evaluation is significantly limited. Soft tissues No soft tissue abnormality is seen. IMPRESSION: Changes consistent with previous fractures involving the distal radius and multiple carpal bones with adjacent well corticated fragments. No findings to suggest acute fracture are seen. Widening of the scapholunate space likely related to chronic ligamentous injury. Nonemergent MRI would be better suited for evaluation. Possibly with intra-articular injection as clinically indicated. Electronically Signed   By: Alcide Clever M.D.   On: 07/30/2017 20:29   Dg Hand Complete Left  Result Date: 07/30/2017 CLINICAL DATA:  Fall through attic floor EXAM: LEFT HAND - COMPLETE 3+ VIEW COMPARISON:  07/30/2017 FINDINGS: Degenerative changes at the radiocarpal joint. No acute bony abnormality. Specifically, no fracture, subluxation, or dislocation. IMPRESSION: No acute bony abnormality. Electronically Signed   By: Charlett Nose M.D.    On: 07/30/2017 16:35    Procedures Procedures (including critical care time)  Medications Ordered in ED Medications  pantoprazole (PROTONIX) EC tablet 40 mg (40 mg Oral Given 07/30/17 1920)  Tdap (BOOSTRIX) injection 0.5 mL (0.5 mLs Intramuscular Given 07/30/17 1636)  oxyCODONE-acetaminophen (PERCOCET/ROXICET) 5-325 MG per tablet 1 tablet (1 tablet Oral Given 07/30/17 1837)  promethazine (PHENERGAN) tablet 12.5 mg (12.5 mg Oral Given 07/30/17 1920)     Initial Impression / Assessment and Plan / ED Course  I have reviewed the triage vital signs and the nursing notes.  Pertinent labs & imaging results that were available during my care of the patient were reviewed by me and considered in my medical decision making (see chart for details).    42 year old male presenting after a fall.  Patient was walking around in the attic and was not walking on the beams, walked on the plywood which broke through the ceiling onto his concrete garage.  Patient had no LOC.  Patient did have his left first digit dislocated which he really  reduced himself.  Patient has not had any symptoms such as nausea vomiting.  His only pain is located in the left wrist and right elbow and lower back.  Patient has a punctate lesion on the right elbow.  3:50 PM Patient appears presently well despite mechanism of injury.  Patient has no CT or pain.  Patient does have a little bit tenderness in his low L-spine.  Will get corresponding imaging.  Patient punctate lesion in the right elbow.  Not think that it violates the joint capsule.  Will clean extensively.  Patient apparently fell on some type of wood chair.  Do not see any foreign bodies.  Will image, will likely leave open to that if there were foreign body could be expelled.  The laceration is less than 0.5 cm.  6:16 PM X-ray of the lumbar spine shows likely old findings, discussed with Dr. Venetia Maxon and have him follow-up as an outpatient.  Patient has no numbness no tingling or  weakness bilateral lower extremities.  Patient has had no problems with that wrist previously.  He does have acute swelling in that wrist.  The x-ray conclusions seem different than the clinical correlation.  Will discuss with Dr. Janee Morn from hand. He has swelling over the snuff box/dorsum of the wrsit.  10 Dr. Janee Morn recommended CAT scan.  CAT scan showed old fracture not new.  He recommended splint for comfort and then his office will call for follow-up.  Patient is comfortable with this plan.  Patient given pain medication as well as muscle relaxants to help with muscle soreness after fall.  We will have him follow-up with primary care physician as well.    Final Clinical Impressions(s) / ED Diagnoses   Final diagnoses:  Fall, initial encounter    ED Discharge Orders        Ordered    cyclobenzaprine (FLEXERIL) 10 MG tablet  2 times daily PRN     07/30/17 1826    ibuprofen (ADVIL,MOTRIN) 800 MG tablet  3 times daily     07/30/17 1826    oxyCODONE-acetaminophen (PERCOCET/ROXICET) 5-325 MG tablet  Every 6 hours PRN     07/30/17 2102       Abelino Derrick, MD 07/30/17 1818    Abelino Derrick, MD 07/30/17 2327

## 2017-07-30 NOTE — Discharge Instructions (Addendum)
You had a tremendous fall today.  We do not see any fractures.  Want you to keep your finger splinted for the next week until it feels improved.   You can use ibuprofen and Flexeril to help with muscle spasms you would likely develop after such a large fall.  In addition we saw that you had old fractures of that left wrist, you need to likely follow-up with a specialist.  Please use the splint provided for comfort.  In terms of the x-ray of your spine, we want you to follow-up with a neurosurgeon, it is likely that the chronic fractures which is causing your chronic pain.

## 2017-07-30 NOTE — ED Triage Notes (Signed)
Patient presents to ED for assessment after falling through the soft ara of his attic on to the concrete garage floor.  Patient denies LOC, denies head injury.  Left arm excoriation marks noted, right elbow has an avulsion with bleeding controlled, and patient c/o acute on chronic lower back pain.

## 2017-07-30 NOTE — ED Notes (Signed)
New orders placed at this time pt no longer up for discharge.

## 2017-08-10 DIAGNOSIS — S63502A Unspecified sprain of left wrist, initial encounter: Secondary | ICD-10-CM | POA: Diagnosis not present

## 2017-11-25 DIAGNOSIS — M1712 Unilateral primary osteoarthritis, left knee: Secondary | ICD-10-CM | POA: Diagnosis not present

## 2018-01-02 DIAGNOSIS — Z0000 Encounter for general adult medical examination without abnormal findings: Secondary | ICD-10-CM | POA: Diagnosis not present

## 2018-01-02 DIAGNOSIS — K219 Gastro-esophageal reflux disease without esophagitis: Secondary | ICD-10-CM | POA: Diagnosis not present

## 2018-01-10 DIAGNOSIS — Z0000 Encounter for general adult medical examination without abnormal findings: Secondary | ICD-10-CM | POA: Diagnosis not present

## 2018-01-10 DIAGNOSIS — G8929 Other chronic pain: Secondary | ICD-10-CM | POA: Diagnosis not present

## 2018-01-10 DIAGNOSIS — E7800 Pure hypercholesterolemia, unspecified: Secondary | ICD-10-CM | POA: Diagnosis not present

## 2018-06-30 DIAGNOSIS — R110 Nausea: Secondary | ICD-10-CM | POA: Diagnosis not present

## 2019-01-16 DIAGNOSIS — R519 Headache, unspecified: Secondary | ICD-10-CM | POA: Diagnosis not present

## 2019-01-16 DIAGNOSIS — R110 Nausea: Secondary | ICD-10-CM | POA: Diagnosis not present

## 2019-01-16 DIAGNOSIS — K219 Gastro-esophageal reflux disease without esophagitis: Secondary | ICD-10-CM | POA: Diagnosis not present

## 2019-08-10 IMAGING — DX DG ELBOW COMPLETE 3+V*R*
4 series · 4 of 4 positions shown · non-contrast
Comparison: None.

CLINICAL DATA: Patient presents to ED for assessment after falling
through the soft area of his attic on to the concrete garage floor.
Patient denies LOC, denies head injury. Left arm excoriation marks
noted, right elbow has an avulsion with bleeding.

EXAM:
RIGHT ELBOW - COMPLETE 3+ VIEW

[elbow ap]
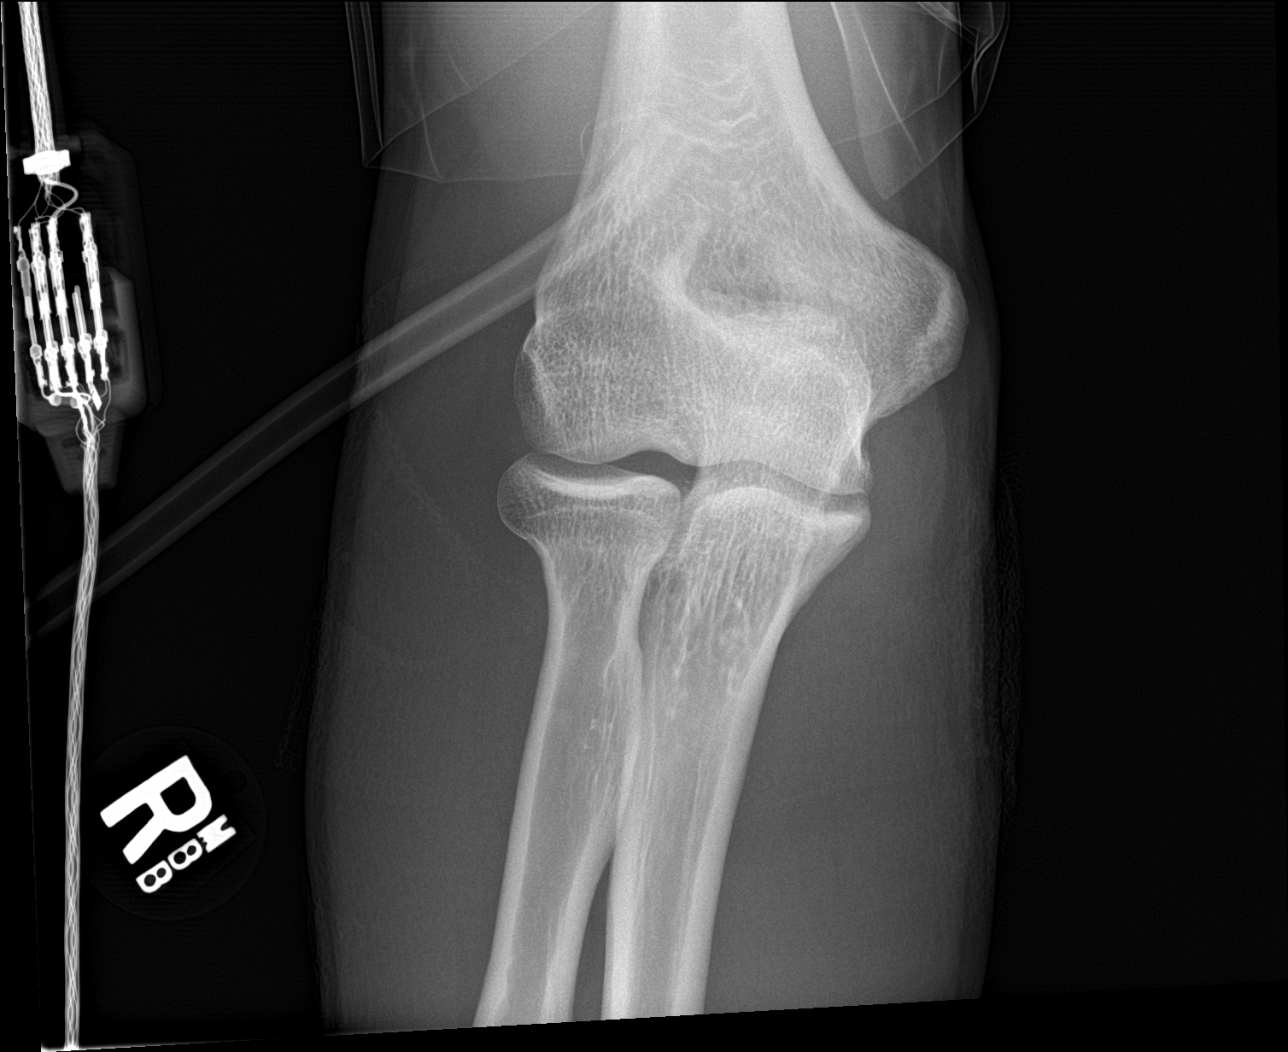

[elbow obl (1 of 2)]
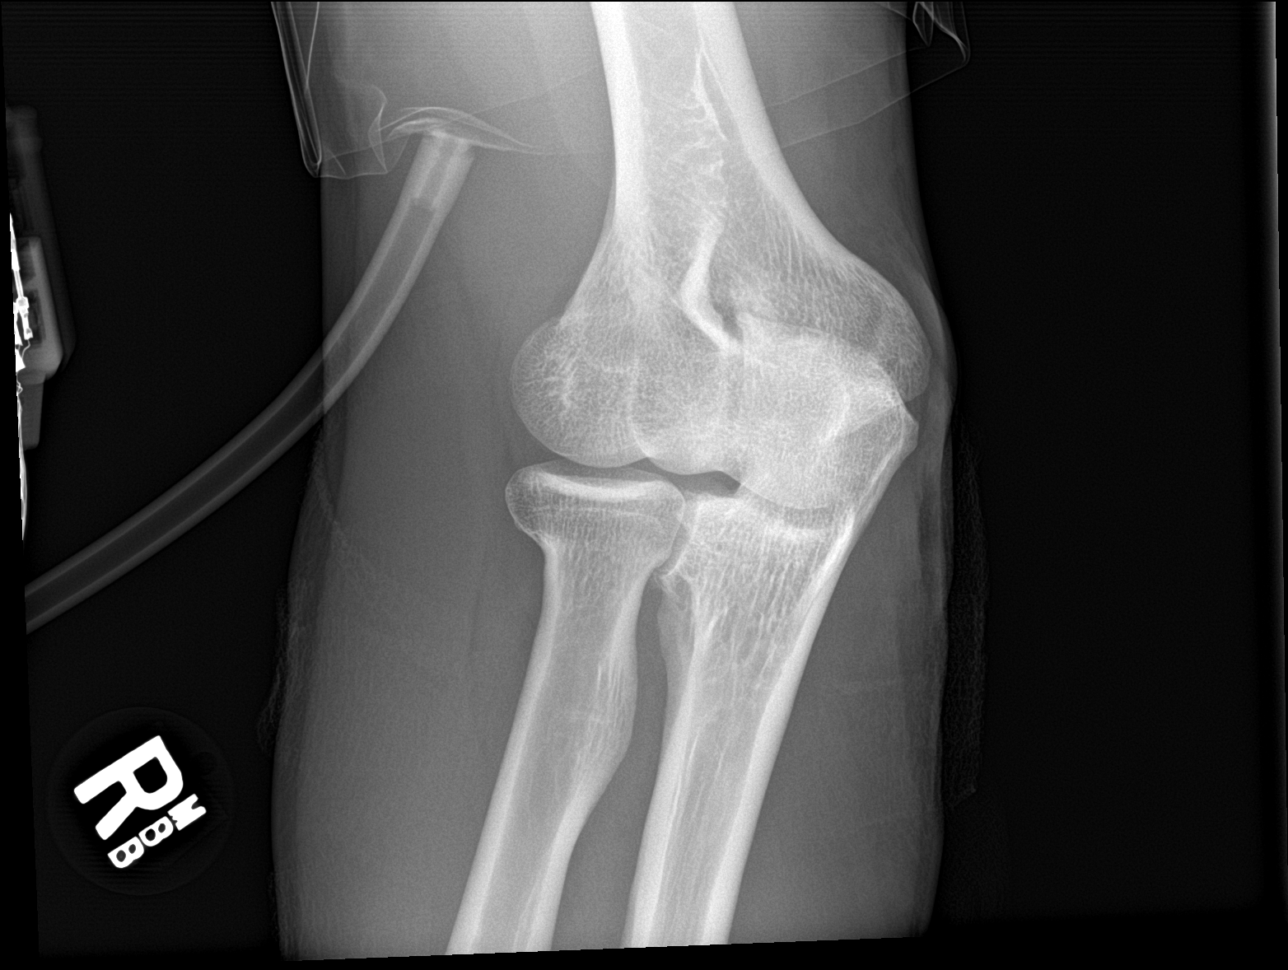

[elbow obl (2 of 2)]
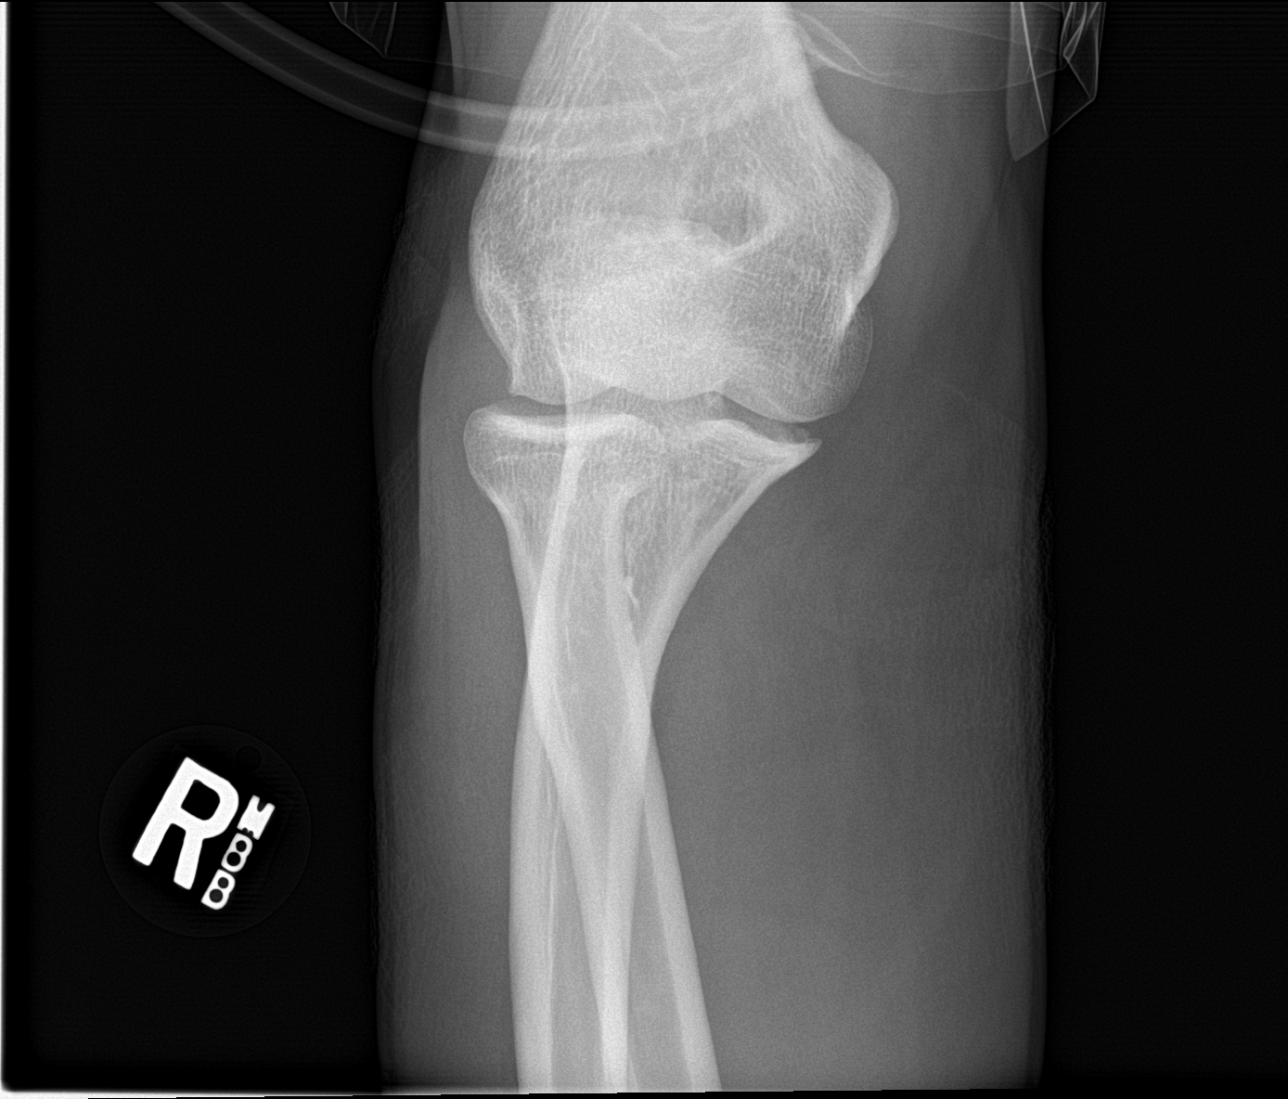

[elbow lat]
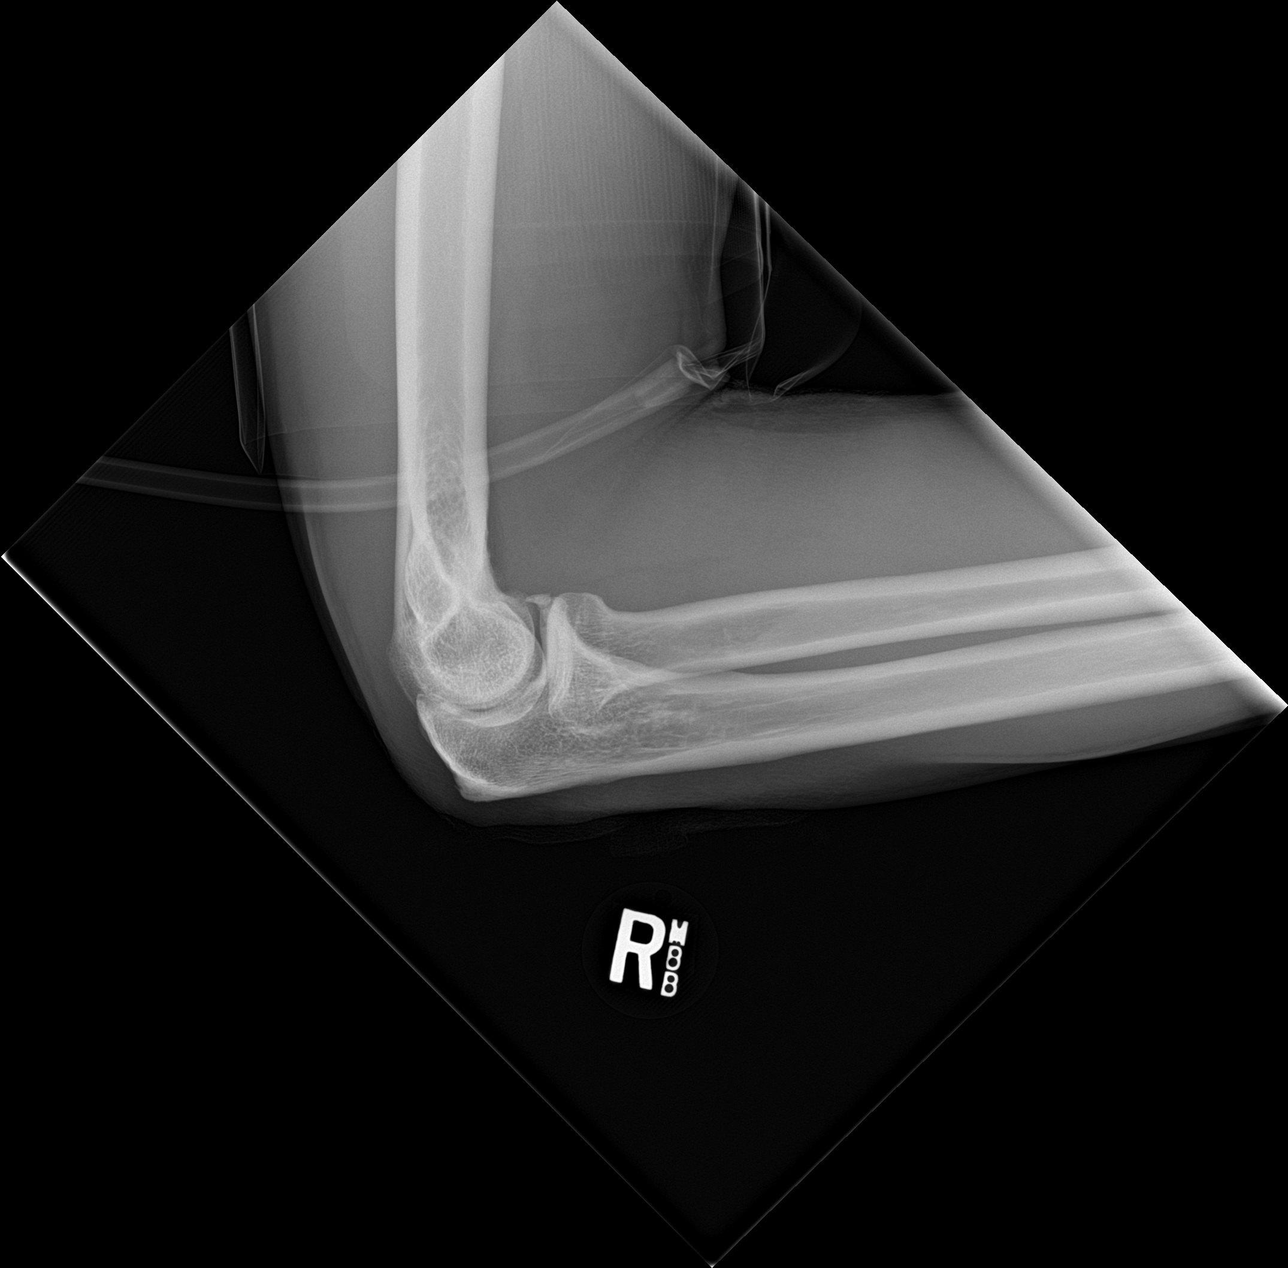

[4 of 4 positions shown; findings below may reference images not displayed]

FINDINGS: There are well corticated degenerative changes at the coronoid
process. No evidence for acute fracture, subluxation, or joint
effusion.
IMPRESSION: No evidence for acute  abnormality.

## 2019-08-10 IMAGING — DX DG WRIST COMPLETE 3+V*L*
3 series · 3 of 3 positions shown · non-contrast
Comparison: None.

CLINICAL DATA: Patient presents to ED for assessment after falling
through the soft area of his attic on to the concrete garage floor.
Patient denies LOC, denies head injury. Left arm excoriation marks
noted, right elbow has an avulsion with bleeding controlled.

EXAM:
LEFT WRIST - COMPLETE 3+ VIEW

[wrist ap]
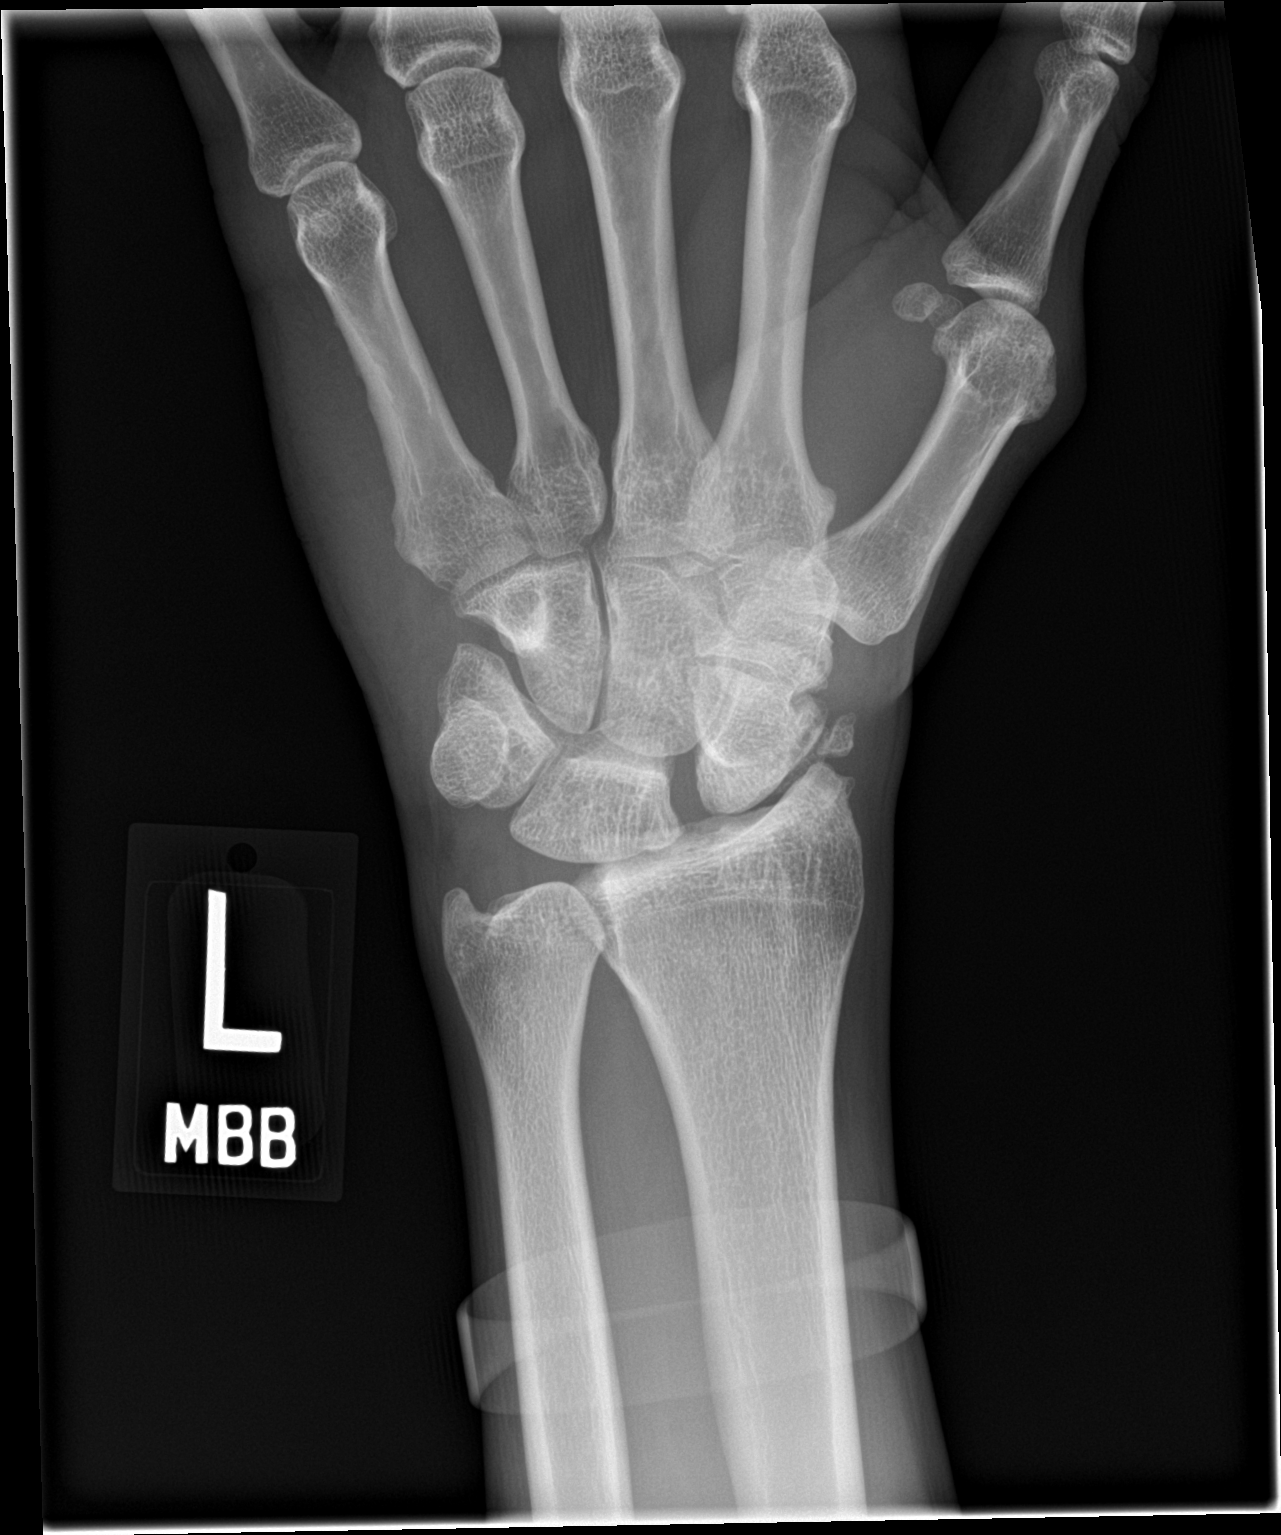

[wrist obl]
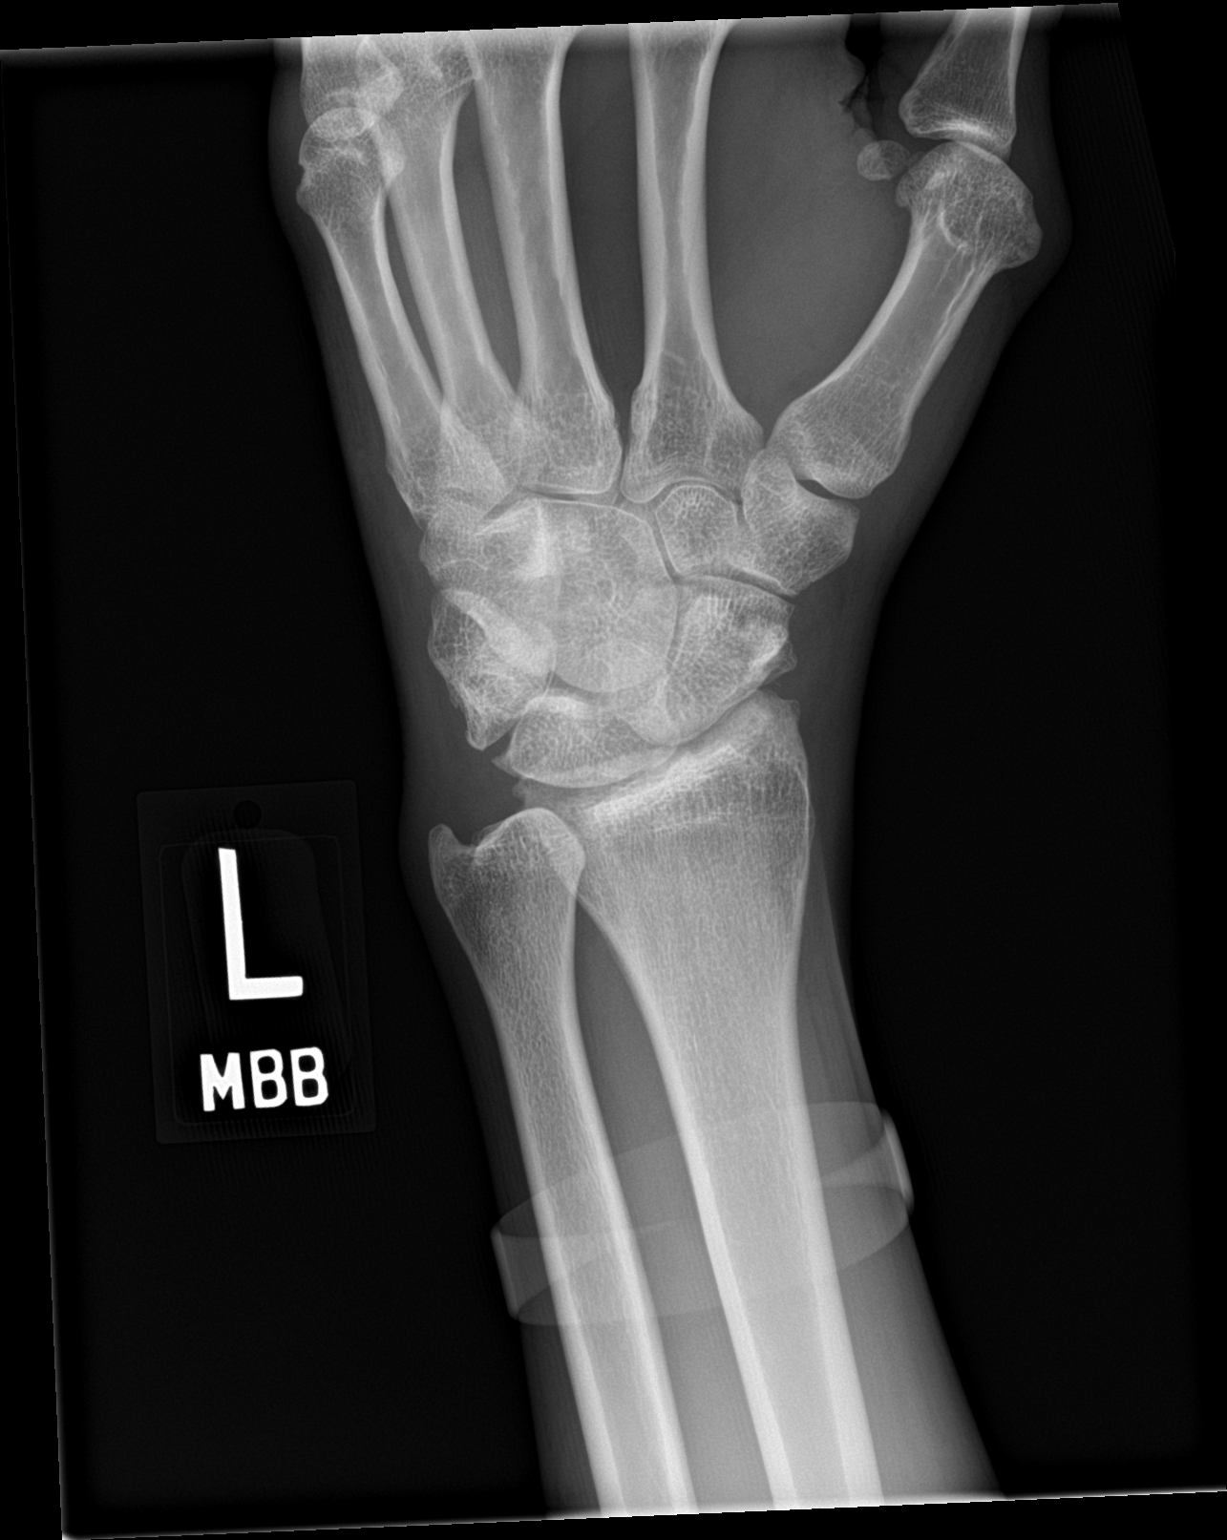

[wrist lat]
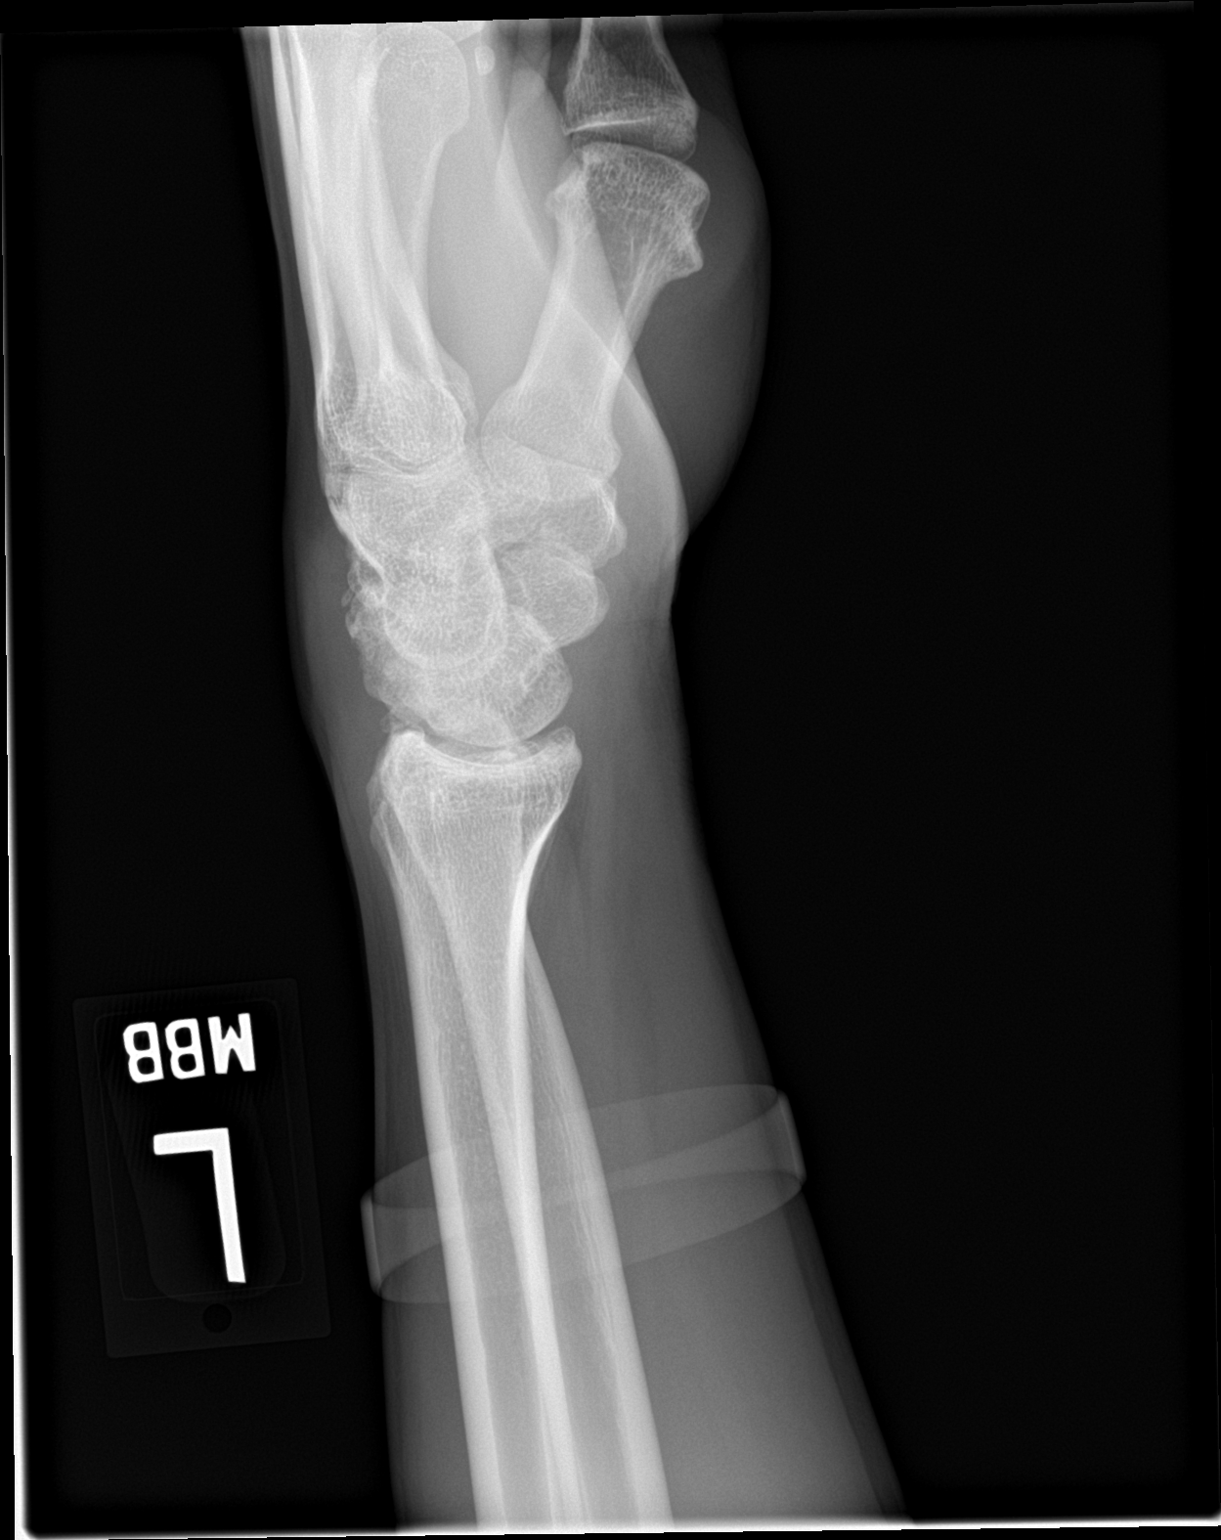

[3 of 3 positions shown; findings below may reference images not displayed]

FINDINGS: There is increased scapholunate distance. A corticated bone density
is identified adjacent to the scaphoid, and the findings favor
remote injury. No definite acute fracture. Intercarpal spaces are
normal.
IMPRESSION: 1. Increased scapholunate distance and remote avulsion fracture in
the region of the radioscaphoid joint. Suspect all the findings are
chronic. However, correlation is recommended with physical exam to
assess acuity, given the lack of prior comparison films.
2. Recommend scaphoid view for further evaluation.

## 2019-08-10 IMAGING — DX DG HAND COMPLETE 3+V*L*
3 series · 3 of 3 positions shown · non-contrast
Comparison: 07/30/2017

CLINICAL DATA: Fall through attic floor

EXAM:
LEFT HAND - COMPLETE 3+ VIEW

[hand pa]
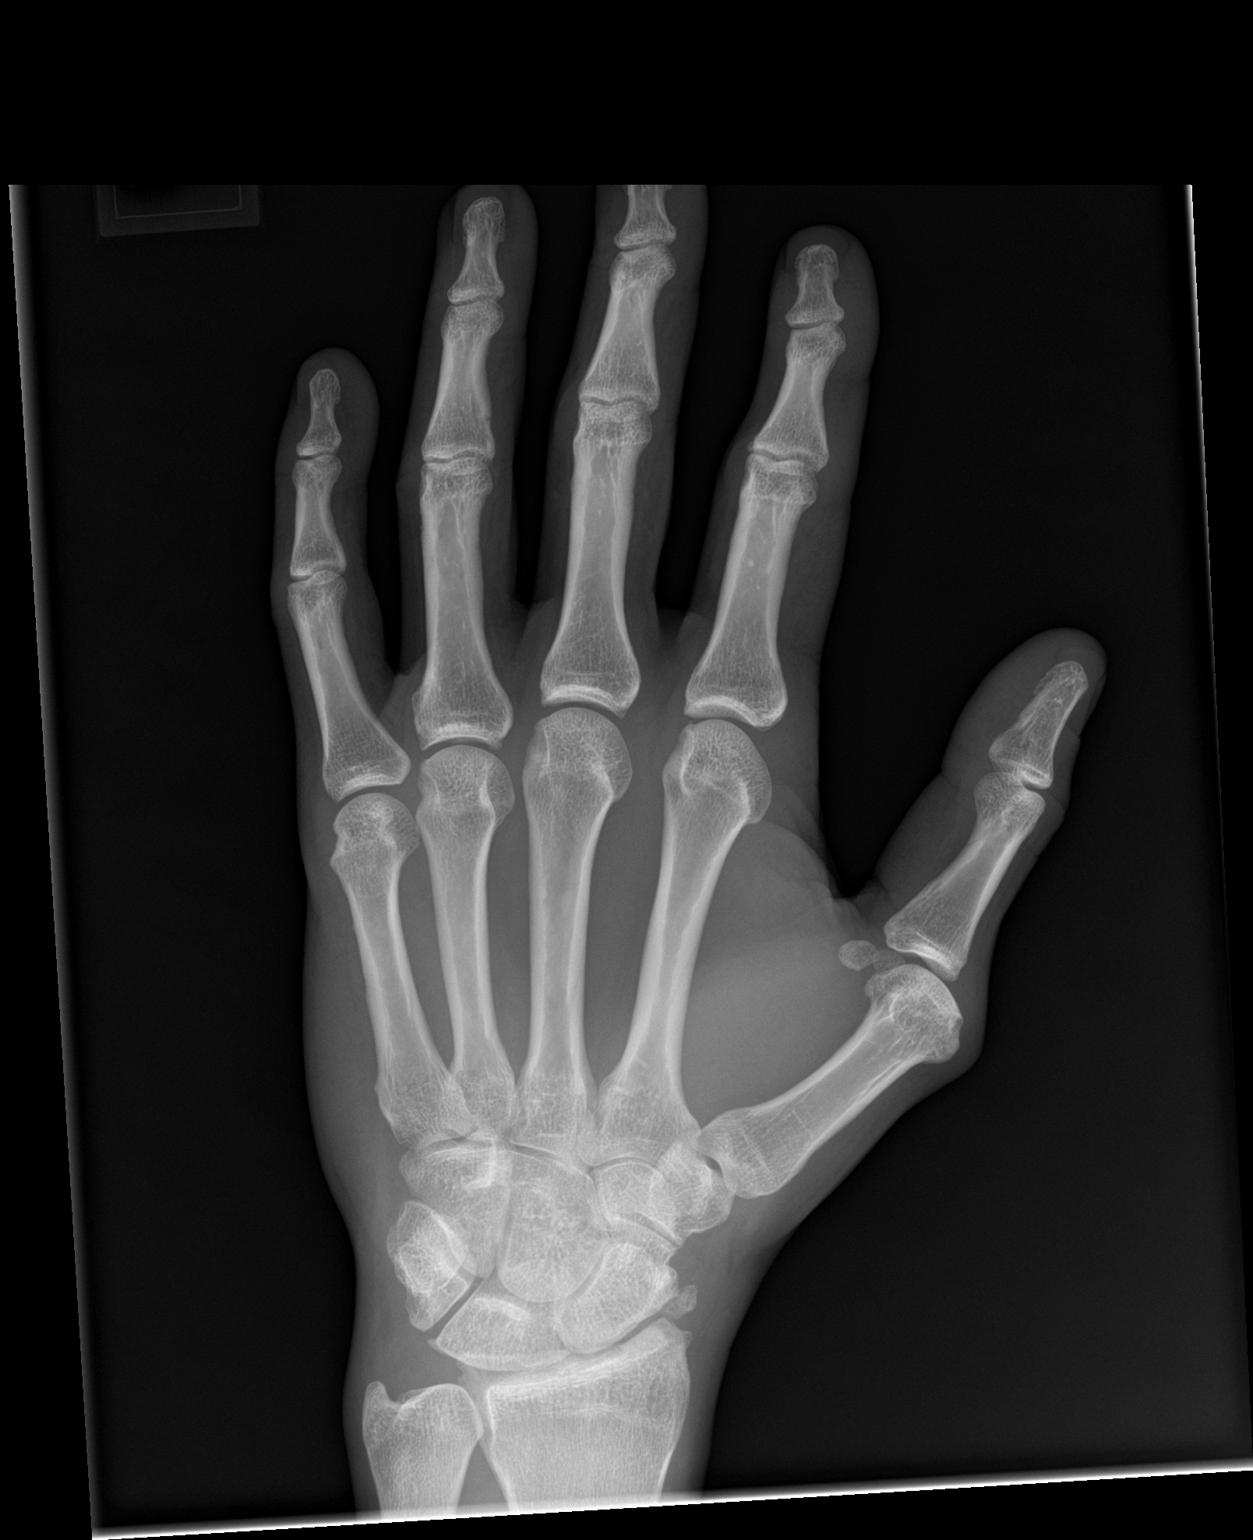

[hand obl]
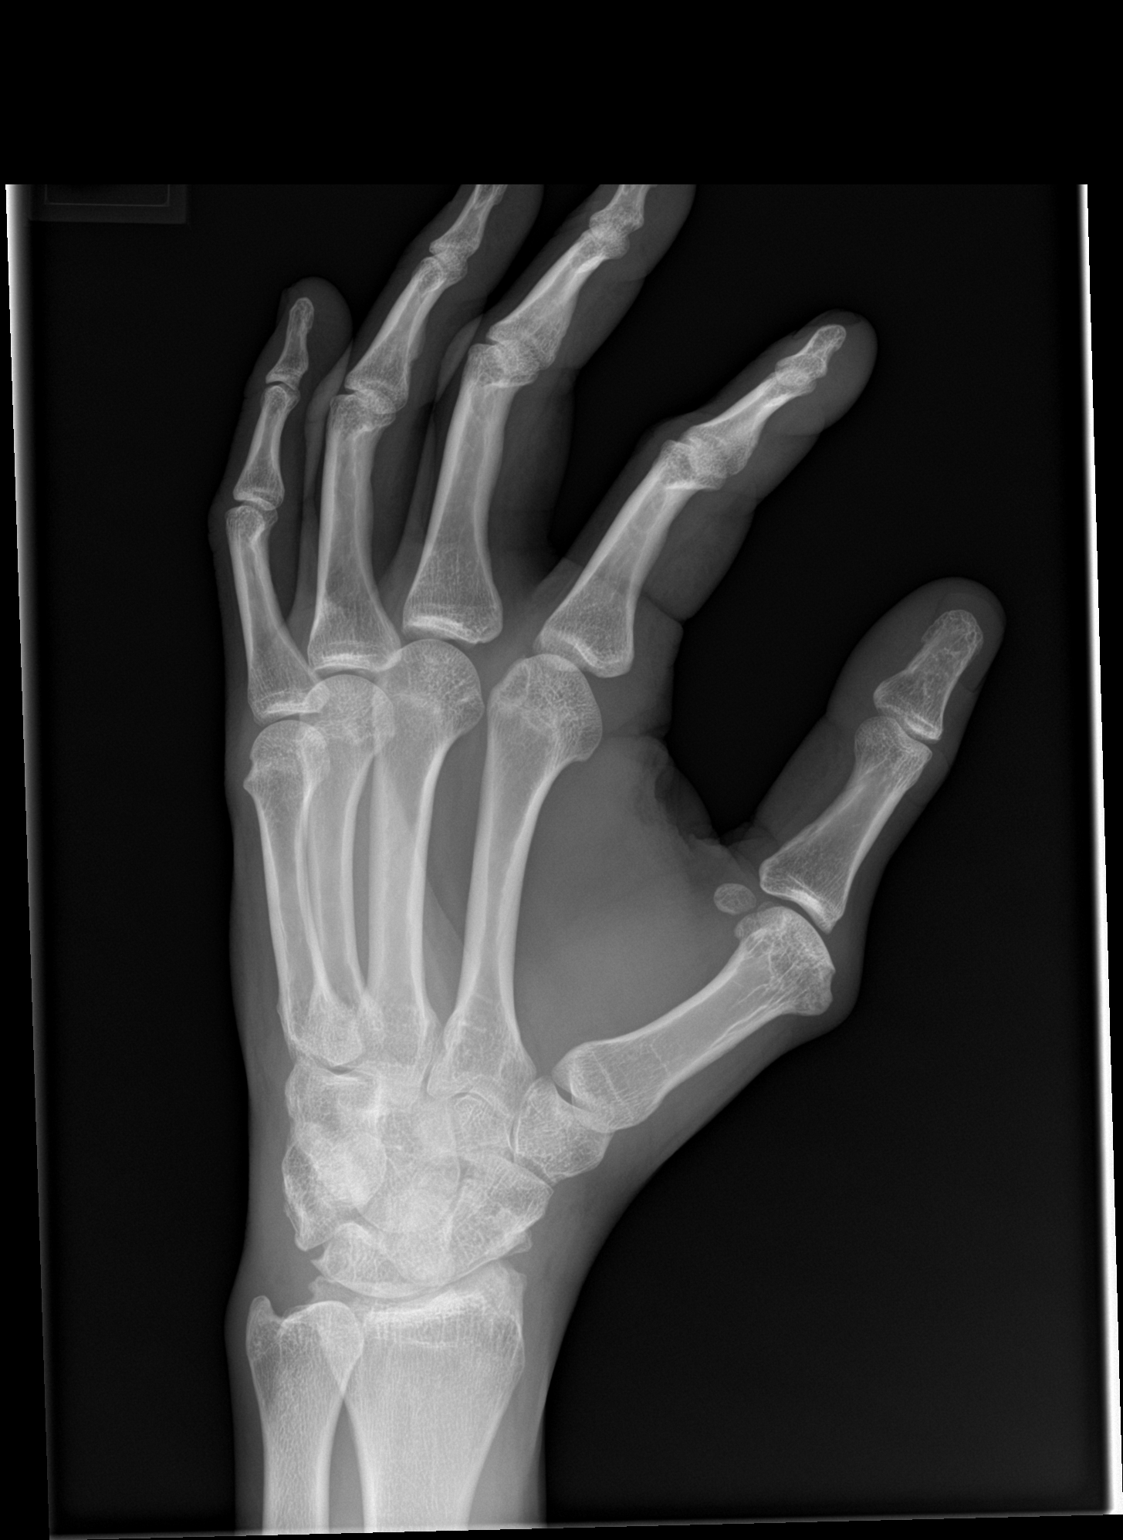

[hand lat]
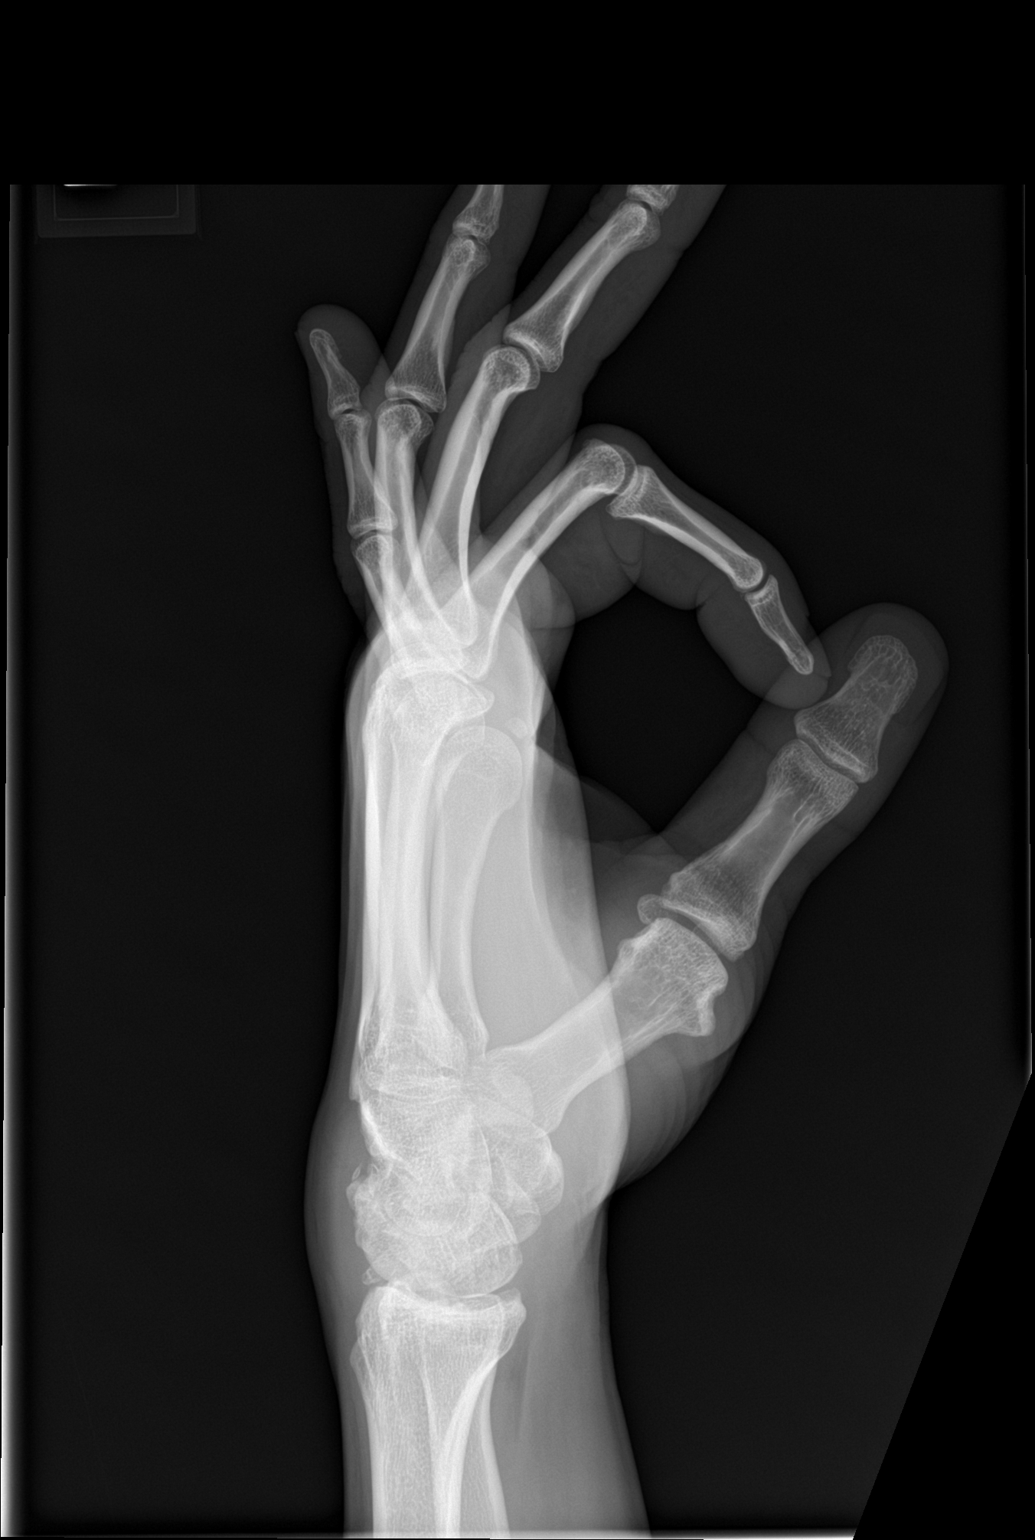

[3 of 3 positions shown; findings below may reference images not displayed]

FINDINGS: Degenerative changes at the radiocarpal joint. No acute bony
abnormality. Specifically, no fracture, subluxation, or dislocation.
IMPRESSION: No acute bony abnormality.

## 2019-08-10 IMAGING — CT CT WRIST*L* W/O CM
3 series · 13 of 33 positions shown, 16 images · non-contrast
Comparison: Plain films from earlier in the same day.

CLINICAL DATA: Recent fall with wrist pain and findings of prior
fractures on recent plain films

EXAM:
CT OF THE LEFT WRIST WITHOUT CONTRAST
TECHNIQUE: Multidetector CT imaging was performed according to the standard
protocol. Multiplanar CT image reconstructions were also generated.

[Series 4: lfov ext 3.0 i31s 2 · axial · 0.38mm/px · z∈[-376,-266]mm · 5 of 55 slices shown, 7 images]
[im 9/55  soft-tissue]
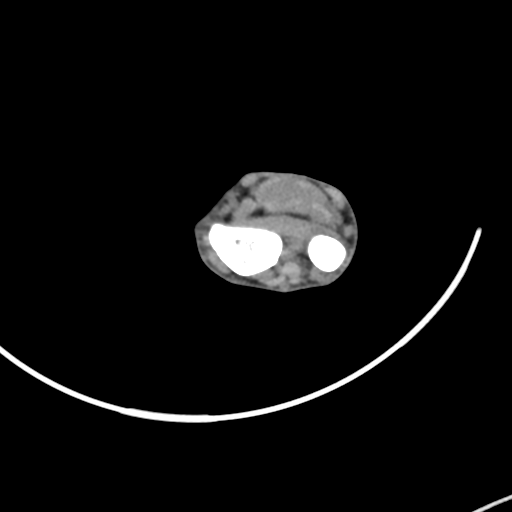
[im 9/55  bone]
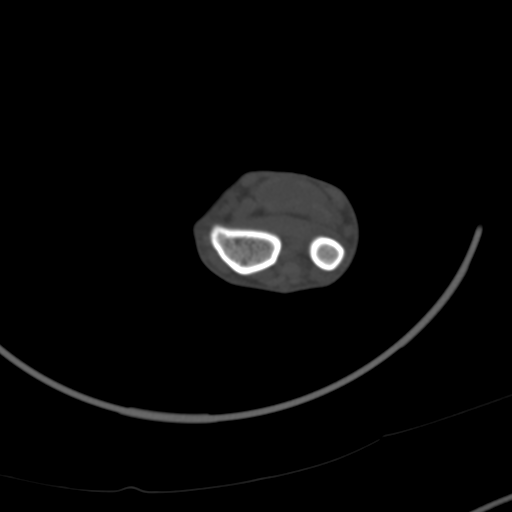
[im 17/55  bone]
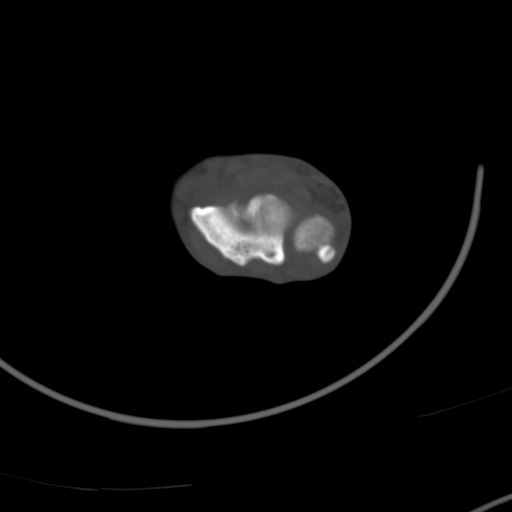
[im 30/55  bone]
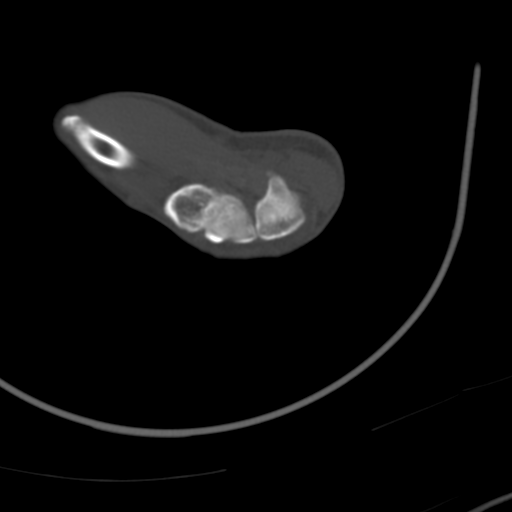
[im 38/55  bone]
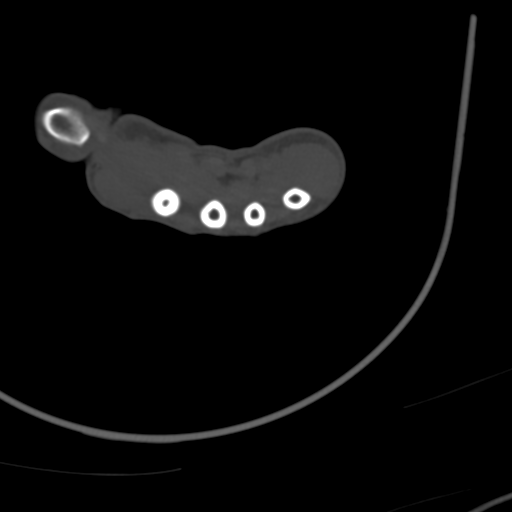
[im 46/55  soft-tissue]
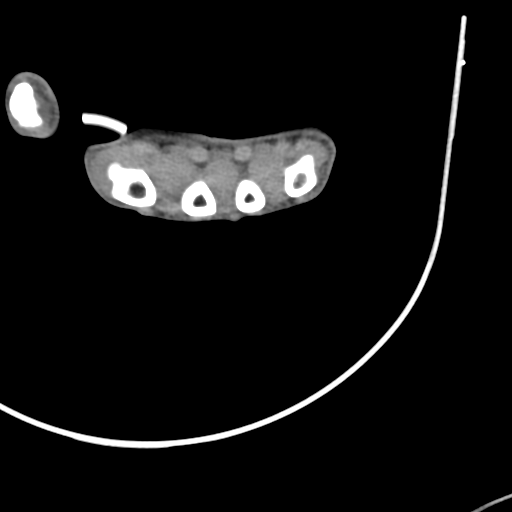
[im 46/55  bone]
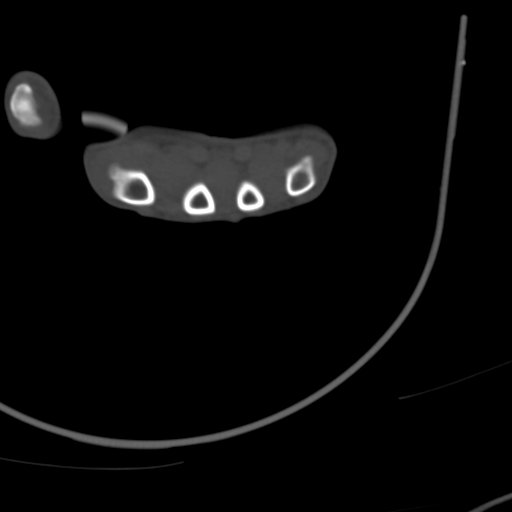

[Series 7: cor st · coronal · 0.32mm/px · 3 of 55 slices shown]
[im 11/55  bone]
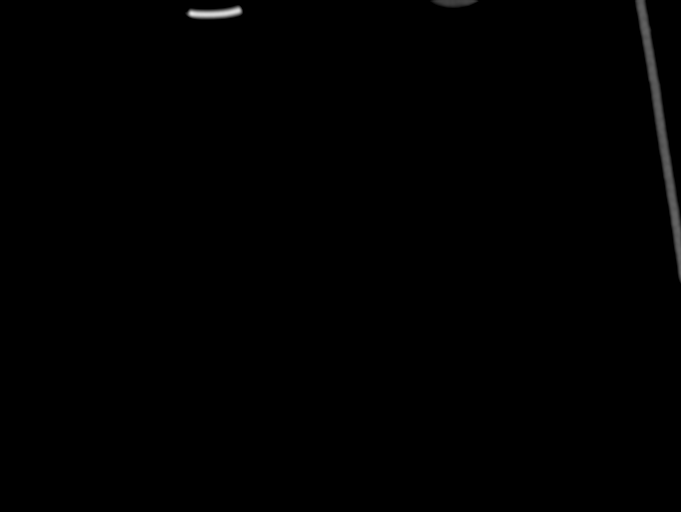
[im 22/55  bone]
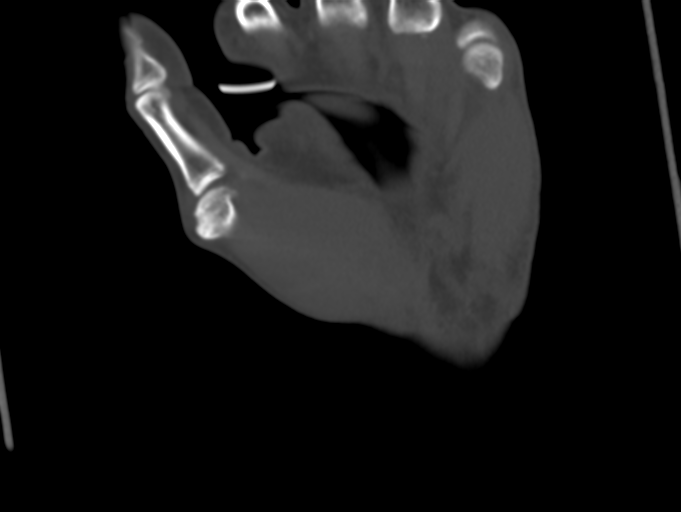
[im 33/55  bone]
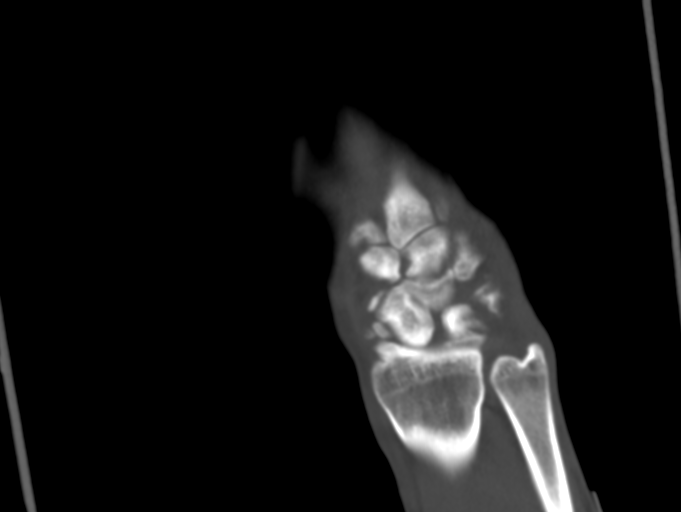

[Series 8: sag st · sagittal · 0.28mm/px · 5 of 59 slices shown, 6 images]
[im 20/59  bone]
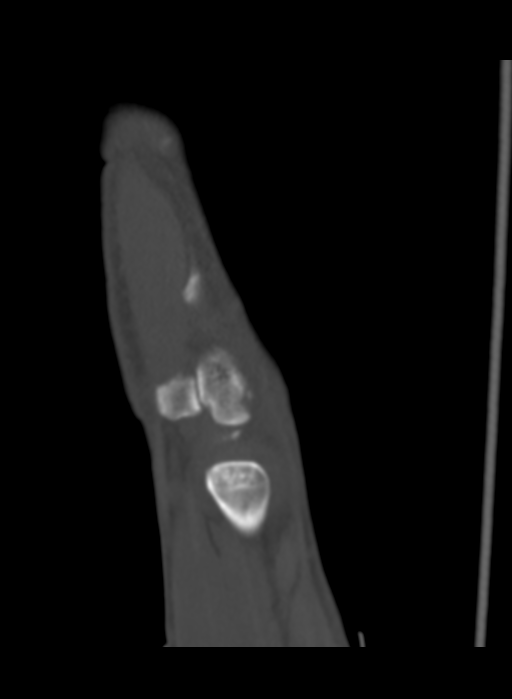
[im 25/59  bone]
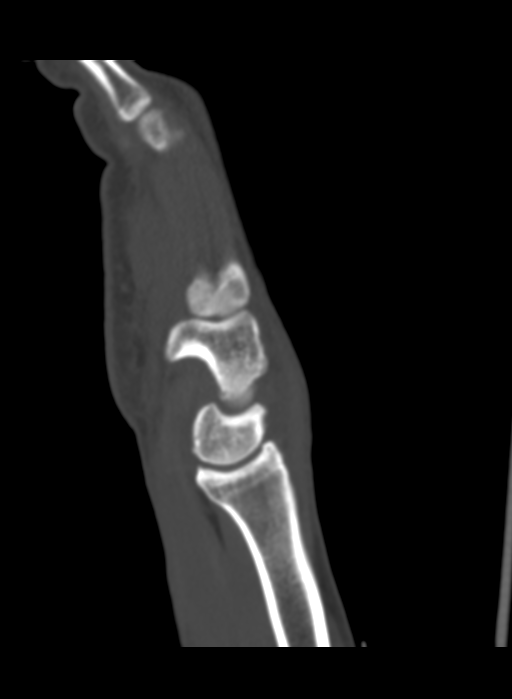
[im 30/59  soft-tissue]
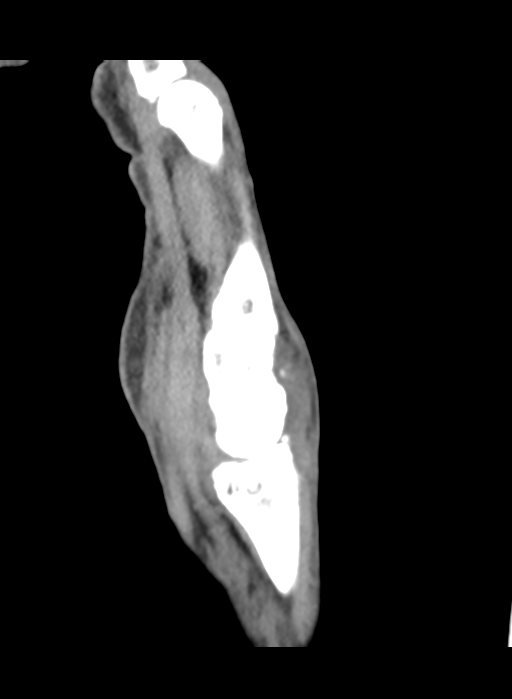
[im 30/59  bone]
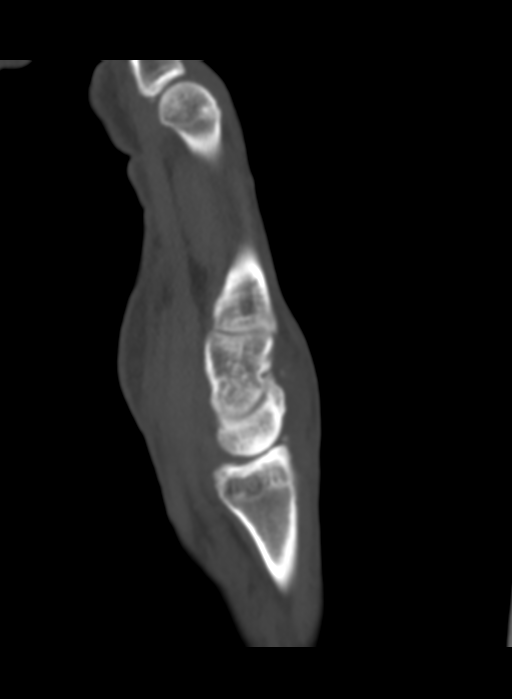
[im 34/59  bone]
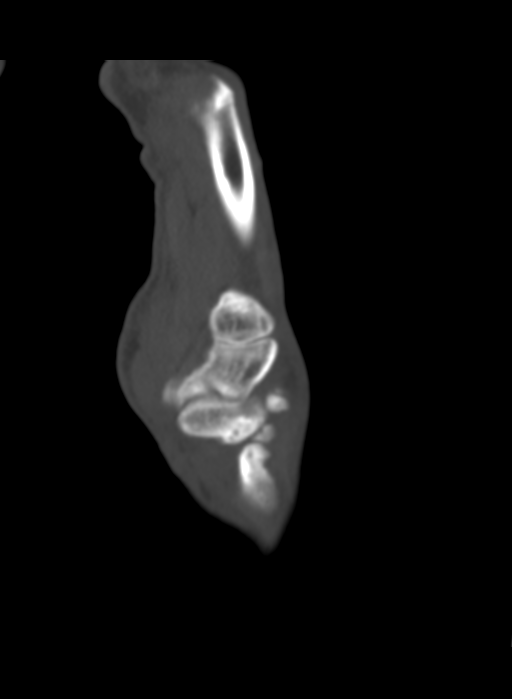
[im 39/59  bone]
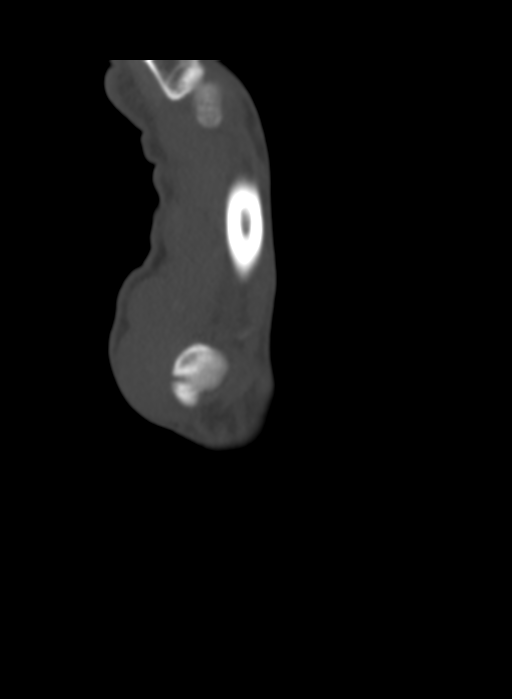

[13 of 33 positions shown; findings below may reference images not displayed]

FINDINGS: Bones/Joint/Cartilage

Well corticated bony densities are noted adjacent to the distal
radius laterally consistent with prior fracture and nonunion. No
acute radial fracture is seen. Some degenerative changes of the
radiocarpal joint are seen. No distal ulnar fracture is noted. Mild
widening of the scapholunate articulation is noted likely related to
prior ligamentous injury. There are multiple small fragments
identified along the posterior carpal bones. These are all well
corticated and consistent with prior fractures with nonunion.
Irregularity of the distal scaphoid is noted similar to that seen on
prior plain film examination also consistent with prior fracture and
nonunion. No acute fracture is identified.

Ligaments

Suboptimally assessed by CT.

Muscles and Tendons

No acute abnormality is identified on this exam although muscle and
tendon evaluation is significantly limited.

Soft tissues

No soft tissue abnormality is seen.
IMPRESSION: Changes consistent with previous fractures involving the distal
radius and multiple carpal bones with adjacent well corticated
fragments. No findings to suggest acute fracture are seen.

Widening of the scapholunate space likely related to chronic
ligamentous injury. Nonemergent MRI would be better suited for
evaluation. Possibly with intra-articular injection as clinically
indicated.

## 2019-09-21 ENCOUNTER — Encounter (HOSPITAL_COMMUNITY): Payer: Self-pay

## 2019-09-21 ENCOUNTER — Other Ambulatory Visit: Payer: Self-pay

## 2019-09-21 ENCOUNTER — Ambulatory Visit (HOSPITAL_COMMUNITY)
Admission: EM | Admit: 2019-09-21 | Discharge: 2019-09-21 | Disposition: A | Discharge status: Home / Self Care | Payer: BC Managed Care – PPO | Attending: Family Medicine | Admitting: Family Medicine

## 2019-09-21 DIAGNOSIS — F418 Other specified anxiety disorders: Secondary | ICD-10-CM

## 2019-09-21 MED ORDER — LORAZEPAM 0.5 MG PO TABS: 0.5000 mg | ORAL_TABLET | Freq: Three times a day (TID) | ORAL | 0 refills | Status: DC

## 2019-09-21 MED ORDER — ONDANSETRON 8 MG PO TBDP: 8.0000 mg | ORAL_TABLET | Freq: Three times a day (TID) | ORAL | 1 refills | Status: DC | PRN

## 2019-09-21 NOTE — ED Triage Notes (Signed)
Pt states he has been a big traveler but the last couple years when he travels or has upcoming significant event, he starts having abdominal cramps, feeling 'queasy', decreased appetite.  Starts feeling better once he returns home and back into normal routine.  Pt thinks it may r/t anxiety but has never been dx.  Would like to see if he can get something to help during upcoming trip to [place].

## 2019-09-21 NOTE — ED Provider Notes (Signed)
[facility]    CSN: [id number] Arrival date & time: 09/21/19  2025      History   Chief Complaint Chief Complaint  Patient presents with  . Anxiety    HPI Jamie Day is a 44 y.o. male.   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 Lately, however, probable causes him severe anxiety.  Just thinking about traveling makes him feel anxious and queasy.  Sometimes when he travels he throws up in the airport or the hotel when he gets to his destination.  He states the day prior to and the first couple of days of his vacation are always ruined.  He states that he does not have a family doctor.  Does not get regular medical care.  He did have a checkup about a year ago with blood work that he states was normal.  He was never diagnosed with anxiety or depression before. In between traveling trips he does feel like he is getting a little more upset easily and anxious.  We discussed that this is a medical problem that would need to be worked up and treated by a family doctor.  Family medicine referral is given at this visit. He does have an upcoming trip in July.  He would like to try medication to see if it helps him. Reviewed his chart.  He has never had any history of drug or alcohol addiction. He started taking Nexium for GERD.  He had less acid reflux and stomach upset with this, but still has trouble with travel  Past Medical History:  Diagnosis Date  . Acid reflux     There are no problems to display for this patient.   History reviewed. No pertinent surgical history.    Home Medications    Prior to Admission medications   Medication Sig Start Date End Date Taking? Authorizing Provider  esomeprazole (NEXIUM) 20 MG capsule Take 20 mg by mouth daily.   Yes [provider]  LORazepam (ATIVAN) 0.5 MG tablet Take 1 tablet (0.5 mg total) by mouth every 8 (eight) hours.  09/21/19   Eustace Moore, MD  ondansetron (ZOFRAN ODT) 8 MG disintegrating tablet Take 1 tablet (8 mg total) by mouth every 8 (eight) hours as needed for nausea or vomiting. 09/21/19   Eustace Moore, MD    Family History No family history on file.  Social History Social History   Tobacco Use  . Smoking status: Former Smoker    Packs/day: 0.50    Years: 20.00    Pack years: 10.00  . Smokeless tobacco: Never Used  Vaping Use  . Vaping Use: Never used  Substance Use Topics  . Alcohol use: No  . Drug use: No     Allergies   Patient has no known allergies.   Review of Systems Review of Systems  Gastrointestinal: Positive for abdominal pain, nausea and vomiting.  Psychiatric/Behavioral: The patient is nervous/anxious.      Physical Exam Triage Vital Signs ED Triage Vitals  Enc Vitals Group     BP 09/21/19 0905 116/80     Pulse Rate 09/21/19 0905 88     Resp 09/21/19 0905 18     Temp --      Temp src --      SpO2 09/21/19 0905 99 %     Weight --      Height --  Head Circumference --      Peak Flow --      Pain Score 09/21/19 0901 0     Pain Loc --      Pain Edu? --      Excl. in GC? --    No data found.  Updated Vital Signs BP 116/80 (BP Location: Left Arm)   Pulse 88   Resp 18   SpO2 99%      Physical Exam Constitutional:      General: He is not in acute distress.    Appearance: He is well-developed and normal weight.  HENT:     Head: Normocephalic and atraumatic.     Mouth/Throat:     Comments: Mask in place Eyes:     Conjunctiva/sclera: Conjunctivae normal.     Pupils: Pupils are equal, round, and reactive to light.  Cardiovascular:     Rate and Rhythm: Normal rate and regular rhythm.     Heart sounds: Normal heart sounds.  Pulmonary:     Effort: Pulmonary effort is normal. No respiratory distress.     Breath sounds: Normal breath sounds.  Abdominal:     General: Abdomen is flat. There is no distension.     Palpations: Abdomen  is soft.     Comments: No organomegaly  Musculoskeletal:        General: Normal range of motion.     Cervical back: Normal range of motion.  Skin:    General: Skin is warm and dry.  Neurological:     General: No focal deficit present.     Mental Status: He is alert.     Deep Tendon Reflexes: Reflexes normal.  Psychiatric:        Mood and Affect: Mood normal.        Behavior: Behavior normal.      UC Treatments / Results  Labs (all labs ordered are listed, but only abnormal results are displayed) Labs Reviewed - No data to display  EKG   Radiology No results found.  Procedures Procedures (including critical care time)  Medications Ordered in UC Medications - No data to display  Initial Impression / Assessment and Plan / UC Course  I have reviewed the triage vital signs and the nursing notes.  Pertinent labs & imaging results that were available during my care of the patient were reviewed by me and considered in my medical decision making (see chart for details).     Normal physical examination Healthy by history Discussed situational anxiety.  Recommend learning self meditation and breathing techniques to calm himself Will give a limited number of benzodiazepines for his upcoming trip Will give a limited number of Zofran for nausea Needs PCP follow-up Final Clinical Impressions(s) / UC Diagnoses   Final diagnoses:  Situational anxiety     Discharge Instructions     I am prescribing Zofran (ondansetron) for nausea Take this as needed at the first sign of nausea Take lorazepam as needed for anxiety If 1 tablet is not working after 30 minutes take a second dose Take no more than 1 mg twice a day See your family doctor for refills and for follow-up   ED Prescriptions    Medication Sig Dispense Auth. Provider   ondansetron (ZOFRAN ODT) 8 MG disintegrating tablet Take 1 tablet (8 mg total) by mouth every 8 (eight) hours as needed for nausea or vomiting. 20  tablet Eustace Moore, MD   LORazepam (ATIVAN) 0.5 MG tablet Take 1 tablet (0.5 mg  total) by mouth every 8 (eight) hours. 30 tablet Raylene Everts, MD     I have reviewed the PDMP during this encounter.   Raylene Everts, MD 09/21/19 1022

## 2019-09-21 NOTE — Discharge Instructions (Addendum)
I am prescribing Zofran (ondansetron) for nausea Take this as needed at the first sign of nausea Take lorazepam as needed for anxiety If 1 tablet is not working after 30 minutes take a second dose Take no more than 1 mg twice a day See your family doctor for refills and for follow-up

## 2021-02-24 ENCOUNTER — Other Ambulatory Visit: Payer: Self-pay

## 2021-02-24 DIAGNOSIS — R748 Abnormal levels of other serum enzymes: Secondary | ICD-10-CM

## 2021-03-19 ENCOUNTER — Other Ambulatory Visit: Payer: Self-pay

## 2021-03-19 DIAGNOSIS — R160 Hepatomegaly, not elsewhere classified: Secondary | ICD-10-CM

## 2021-03-28 LAB — HM COLONOSCOPY

## 2021-07-12 ENCOUNTER — Encounter: Payer: Self-pay | Admitting: Emergency Medicine

## 2021-07-12 ENCOUNTER — Other Ambulatory Visit: Payer: Self-pay

## 2021-07-12 ENCOUNTER — Ambulatory Visit
Admission: EM | Admit: 2021-07-12 | Discharge: 2021-07-12 | Disposition: A | Discharge status: Home / Self Care | Payer: BC Managed Care – PPO | Attending: Internal Medicine | Admitting: Internal Medicine

## 2021-07-12 DIAGNOSIS — J020 Streptococcal pharyngitis: Secondary | ICD-10-CM | POA: Diagnosis not present

## 2021-07-12 LAB — POCT RAPID STREP A (OFFICE): Rapid Strep A Screen: POSITIVE — AB

## 2021-07-12 MED ORDER — AMOXICILLIN 500 MG PO CAPS: 500.0000 mg | ORAL_CAPSULE | Freq: Three times a day (TID) | ORAL | 0 refills | Status: DC

## 2021-07-12 NOTE — ED Triage Notes (Signed)
Sore throat, nasal congestion, fever starting Thursday after his son got sick. ?

## 2021-07-12 NOTE — ED Provider Notes (Signed)
?[facility] ? ? ? ?CSN: [id number] ?Arrival date & time: 07/12/21  0808 ? ? ?  ? ?History   ?Chief Complaint ?Chief Complaint  ?Patient presents with  ? Sore Throat  ? ? ?HPI ?Jamie Day is a 46 y.o. male.  ? ?Patient here today for evaluation of sore throat, nasal congestion and fever that started about 3 days ago after his son and wife were sick. He has had fever but cannot recall the thermometer reading. He has not had any vomiting or diarrhea. He has tried OTC meds without resolution of symptoms. ? ?The history is provided by the patient.  ? ?Past Medical History:  ?Diagnosis Date  ? Acid reflux   ? ? ?There are no problems to display for this patient. ? ? ?History reviewed. No pertinent surgical history. ? ? ? ? ?Home Medications   ? ?Prior to Admission medications   ?Medication Sig Start Date End Date Taking? Authorizing Provider  ?amoxicillin (AMOXIL) 500 MG capsule Take 1 capsule (500 mg total) by mouth 3 (three) times daily. 07/12/21  Yes Francene Finders, PA-C  ?esomeprazole (NEXIUM) 20 MG capsule Take 20 mg by mouth daily.    [provider]  ?LORazepam (ATIVAN) 0.5 MG tablet Take 1 tablet (0.5 mg total) by mouth every 8 (eight) hours. 09/21/19   Raylene Everts, MD  ?ondansetron (ZOFRAN ODT) 8 MG disintegrating tablet Take 1 tablet (8 mg total) by mouth every 8 (eight) hours as needed for nausea or vomiting. 09/21/19   Raylene Everts, MD  ?promethazine (PHENERGAN) 25 MG tablet Take 25 mg by mouth every 6 (six) hours as needed for nausea or vomiting.    [provider]  ?cetirizine (ZYRTEC) 10 MG tablet Take 10 mg by mouth daily.  09/21/19  [provider]  ?fluticasone (FLONASE) 50 MCG/ACT nasal spray Place 2 sprays into both nostrils daily.  09/21/19  [provider]  ? ? ?Family History ?History reviewed. No pertinent family history. ? ?Social History ?Social History  ? ?Tobacco Use  ? Smoking status: Former  ?  Packs/day: 0.50  ?  Years: 20.00  ?   Pack years: 10.00  ?  Types: Cigarettes  ? Smokeless tobacco: Never  ?Vaping Use  ? Vaping Use: Never used  ?Substance Use Topics  ? Alcohol use: No  ? Drug use: No  ? ? ? ?Allergies   ?Patient has no known allergies. ? ? ?Review of Systems ?Review of Systems  ?Constitutional:  Positive for fever. Negative for chills.  ?HENT:  Positive for congestion and sore throat. Negative for ear pain.   ?Eyes:  Negative for discharge and redness.  ?Respiratory:  Negative for cough and shortness of breath.   ?Gastrointestinal:  Negative for abdominal pain, nausea and vomiting.  ? ? ?Physical Exam ?Triage Vital Signs ?ED Triage Vitals  ?Enc Vitals Group  ?   BP   ?   Pulse   ?   Resp   ?   Temp   ?   Temp src   ?   SpO2   ?   Weight   ?   Height   ?   Head Circumference   ?   Peak Flow   ?   Pain Score   ?   Pain Loc   ?   Pain Edu?   ?   Excl. in [place]?   ? ?No data found. ? ?Updated Vital Signs ?BP 110/68 (BP Location: Left  Arm)   Pulse 74   Temp 98.2 ?F (36.8 ?C) (Oral)   Resp 16   SpO2 98%  ?   ? ?Physical Exam ?Vitals and nursing note reviewed.  ?Constitutional:   ?   General: He is not in acute distress. ?   Appearance: Normal appearance. He is not ill-appearing.  ?HENT:  ?   Head: Normocephalic and atraumatic.  ?   Nose: Congestion present.  ?   Mouth/Throat:  ?   Mouth: Mucous membranes are moist.  ?   Pharynx: Oropharynx is clear. Posterior oropharyngeal erythema present. No oropharyngeal exudate.  ?Eyes:  ?   Conjunctiva/sclera: Conjunctivae normal.  ?Cardiovascular:  ?   Rate and Rhythm: Normal rate.  ?Pulmonary:  ?   Effort: Pulmonary effort is normal. No respiratory distress.  ?Skin: ?   General: Skin is warm and dry.  ?Neurological:  ?   Mental Status: He is alert.  ?Psychiatric:     ?   Mood and Affect: Mood normal.     ?   Thought Content: Thought content normal.  ? ? ? ?UC Treatments / Results  ?Labs ?(all labs ordered are listed, but only abnormal results are displayed) ?Labs Reviewed  ?POCT RAPID STREP A  (OFFICE) - Abnormal; Notable for the following components:  ?    Result Value  ? Rapid Strep A Screen Positive (*)   ? All other components within normal limits  ? ? ?EKG ? ? ?Radiology ?No results found. ? ?Procedures ?Procedures (including critical care time) ? ?Medications Ordered in UC ?Medications - No data to display ? ?Initial Impression / Assessment and Plan / UC Course  ?I have reviewed the triage vital signs and the nursing notes. ? ?Pertinent labs & imaging results that were available during my care of the patient were reviewed by me and considered in my medical decision making (see chart for details). ? ?  ?Amoxicillin prescribed for strep treatment. Encouraged follow up if symptoms persist or worsen.  ? ?Final Clinical Impressions(s) / UC Diagnoses  ? ?Final diagnoses:  ?Strep pharyngitis  ? ?Discharge Instructions   ?None ?  ? ?ED Prescriptions   ? ? Medication Sig Dispense Auth. Provider  ? amoxicillin (AMOXIL) 500 MG capsule Take 1 capsule (500 mg total) by mouth 3 (three) times daily. 21 capsule Francene Finders, PA-C  ? ?  ? ?PDMP not reviewed this encounter. ?  ?Francene Finders, PA-C ?07/12/21 [phone] ? ?

## 2023-10-04 ENCOUNTER — Ambulatory Visit (INDEPENDENT_AMBULATORY_CARE_PROVIDER_SITE_OTHER): Admitting: Family Medicine

## 2023-10-04 ENCOUNTER — Encounter: Payer: Self-pay | Admitting: Family Medicine

## 2023-10-04 VITALS — BP 108/62 | HR 61 | Temp 98.0°F | Ht 77.0 in | Wt 180.2 lb

## 2023-10-04 DIAGNOSIS — M5442 Lumbago with sciatica, left side: Secondary | ICD-10-CM

## 2023-10-04 DIAGNOSIS — G8929 Other chronic pain: Secondary | ICD-10-CM

## 2023-10-04 DIAGNOSIS — F419 Anxiety disorder, unspecified: Secondary | ICD-10-CM

## 2023-10-04 DIAGNOSIS — M153 Secondary multiple arthritis: Secondary | ICD-10-CM

## 2023-10-04 DIAGNOSIS — M5441 Lumbago with sciatica, right side: Secondary | ICD-10-CM | POA: Diagnosis not present

## 2023-10-04 MED ORDER — METHYLPREDNISOLONE 4 MG PO TBPK: ORAL_TABLET | ORAL | 0 refills | Status: DC

## 2023-10-04 MED ORDER — DULOXETINE HCL 30 MG PO CPEP: ORAL_CAPSULE | ORAL | 3 refills | Status: DC

## 2023-10-04 MED ORDER — PROMETHAZINE HCL 25 MG PO TABS: 25.0000 mg | ORAL_TABLET | Freq: Four times a day (QID) | ORAL | 0 refills | Status: DC | PRN

## 2023-10-04 MED ORDER — ONDANSETRON 8 MG PO TBDP: 8.0000 mg | ORAL_TABLET | Freq: Three times a day (TID) | ORAL | 1 refills | Status: DC | PRN

## 2023-10-04 NOTE — Patient Instructions (Signed)
  VISIT SUMMARY: Today, we discussed the management of your chronic back pain, knee and wrist pain, and anxiety. We reviewed your history and current symptoms, and I have provided a treatment plan to help manage your conditions.  YOUR PLAN: -CHRONIC LOWER BACK PAIN: Chronic lower back pain is persistent pain in the lower back, often due to injury or arthritis. We will start you on a course of steroids (methylprednisolone ) to help reduce inflammation and pain. Additionally, I am referring you to an orthopedic specialist for further evaluation and possible targeted injections.  -TRAUMATIC ARTHRITIS OF THE WRIST: Traumatic arthritis in the wrist is arthritis that develops after an injury, causing pain and stiffness. I am referring you to an orthopedic specialist for further evaluation and possible targeted injections to help manage your symptoms.  -KNEE ARTHRITIS: Knee arthritis is the inflammation of the knee joint, often causing pain and stiffness. I am referring you to an orthopedic specialist for further evaluation and possible targeted injections to help manage your symptoms.  -ANXIETY DISORDER: Anxiety disorder is a mental health condition characterized by excessive worry and physical symptoms like nausea and sweating. We will start you on duloxetine, beginning with 30 mg for one week and then increasing to 60 mg daily. This medication can help manage both anxiety and chronic pain. Additionally, I am prescribing ondansetron  and promethazine  for situational nausea.  -GENERAL HEALTH MAINTENANCE: We discussed the importance of general health maintenance, including routine blood work and mood assessments. We will conduct these during your follow-up appointment.  INSTRUCTIONS: Please follow up in one month to evaluate the effectiveness of your treatment. During this visit, we will also conduct fasting blood work and complete a mood assessment questionnaire.

## 2023-10-04 NOTE — Progress Notes (Signed)
 Assessment & Plan   Assessment/Plan:    Assessment & Plan Chronic Lower Back Pain Chronic lower back pain with spasms, exacerbated by a fall seven years ago. Imaging reveals lower back arthritis. Pain has worsened over the last three to four months, with ice and exercises no longer providing relief. He has not been using any pain medication. A course of steroids, similar to those used in joint injections, is proposed to alleviate symptoms. Referral to orthopedics is recommended for further evaluation and possible targeted injections. - Prescribe methylprednisolone  course pack - Refer to orthopedics for further evaluation and possible targeted injections  Traumatic Arthritis of the Wrist Previous fractures in the left wrist have led to traumatic arthritis, causing pain and discomfort that impact daily activities. Referral to orthopedics is recommended for further evaluation and possible targeted injections. - Refer to orthopedics for evaluation and possible targeted injections  Knee Arthritis Suspected arthritis in the knee, with previous cortisone injections providing relief. He requests further evaluation and treatment. Referral to orthopedics is recommended for further evaluation and possible targeted injections. - Refer to orthopedics for evaluation and possible targeted injections  Anxiety Disorder Anxiety disorder with situational exacerbations, particularly before travel. Symptoms include nausea, sweating, and vomiting. Previous medications provided partial relief. Duloxetine is proposed as a treatment option, which requires regular intake to build up in the system and may help with both anxiety and chronic pain by modulating serotonin and norepinephrine levels. Additional medications for situational nausea are also recommended. - Prescribe duloxetine, starting with 30 mg for one week, then increase to 60 mg daily - Prescribe ondansetron  and promethazine  for situational  nausea  General Health Maintenance Discussion of general health maintenance and the need for baseline assessments. A follow-up is planned to evaluate treatment efficacy and conduct necessary blood work. - Schedule follow-up in one month for evaluation of treatment efficacy - Conduct fasting blood work during follow-up appointment - Complete mood assessment questionnaire      Medications Discontinued During This Encounter  Medication Reason   promethazine  (PHENERGAN ) 25 MG tablet Reorder   ondansetron  (ZOFRAN  ODT) 8 MG disintegrating tablet Reorder    Return in about 1 month (around 11/03/2023) for anxiety, pain, physical (fasting labs).        Subjective:   Encounter date: 10/04/2023  Jamie Day is a 48 y.o. male who does not have a problem list on file.SABRA   He  has a past medical history of Acid reflux.SABRA   He presents with chief complaint of Establish Care, Back Pain, Arthritis (Chronic pain ongoing /Left knee pain/Acid reflux ), Spasms (Wrist pain), and Anxiety .   Discussed the use of AI scribe software for clinical note transcription with the patient, who gave verbal consent to proceed.  History of Present Illness Jamie Day is a 48 year old male who presents for management of chronic back pain and anxiety.  He has experienced chronic back pain that has worsened over the past three to four months. 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 He has not undergone a thorough evaluation since the accident and manages the pain primarily with ice, avoiding ibuprofen  or Tylenol . He has not received any recent treatment for his back since the accident.  He also experiences chronic knee and wrist pain, suspecting  arthritis in the knee and having had  previous fractures in the wrist. Cortisone shots have been helpful in the past for both areas, and he is interested in resuming these treatments.  He experiences significant anxiety, particularly related to travel, with symptoms of nausea, sweating, and feeling sick before trips, which subside once he reaches his destination. He has been prescribed promethazine  and dexedrine in the past, which provided some relief. His anxiety began around the age of 44 and has persisted for the past seven years, affecting his sleep as he wakes up early regardless of bedtime.  He has a history of being a Clinical biochemist, which he believes has contributed to his current musculoskeletal issues. His lifestyle has changed significantly since his athletic career, impacting his physical health. He mentions a history of heavy drinking and partying during his athletic career.      12/16/2015    9:56 AM  Depression screen PHQ 2/9  Decreased Interest 0  Down, Depressed, Hopeless 0  PHQ - 2 Score 0       No data to display            ROS  No past surgical history on file.  Outpatient Medications Prior to Visit  Medication Sig Dispense Refill   amoxicillin  (AMOXIL ) 500 MG capsule Take 1 capsule (500 mg total) by mouth 3 (three) times daily. 21 capsule 0   esomeprazole (NEXIUM) 20 MG capsule Take 20 mg by mouth daily.     LORazepam  (ATIVAN ) 0.5 MG tablet Take 1 tablet (0.5 mg total) by mouth every 8 (eight) hours. 30 tablet 0   ondansetron  (ZOFRAN  ODT) 8 MG disintegrating tablet Take 1 tablet (8 mg total) by mouth every 8 (eight) hours as needed for nausea or vomiting. 20 tablet 1   promethazine  (PHENERGAN ) 25 MG tablet Take 25 mg by mouth every 6 (six) hours as needed for nausea or vomiting.     No facility-administered medications prior to visit.    No family history on file.  Social History   Socioeconomic History   Marital status: Married    Spouse name: Not on file   Number of  children: Not on file   Years of education: Not on file   Highest education level: Not on file  Occupational History   Not on file  Tobacco Use   Smoking status: Former    Current packs/day: 0.50    Average packs/day: 0.5 packs/day for 20.0 years (10.0 ttl pk-yrs)    Types: Cigarettes   Smokeless tobacco: Never  Vaping Use   Vaping status: Never Used  Substance and Sexual Activity   Alcohol use: No   Drug use: No   Sexual activity: Yes    Partners: Female  Other Topics Concern   Not on file  Social History Narrative   Married   1 son   Social Drivers of Corporate investment banker Strain: Not on file  Food Insecurity: Not on file  Transportation Needs: Not on file  Physical Activity: Not on file  Stress: Not on file  Social Connections: Not on file  Intimate Partner Violence: Not on file  Objective:  Physical Exam: BP 108/62   Pulse 61   Temp 98 F (36.7 C) (Temporal)   Ht 6' 5 (1.956 m)   Wt 180 lb 3.2 oz (81.7 kg)   SpO2 98%   BMI 21.37 kg/m    Physical Exam GENERAL: Alert, cooperative, well developed, no acute distress HEENT: Normocephalic, normal oropharynx, moist mucous membranes CHEST: Clear to auscultation bilaterally, No wheezes, rhonchi, or crackles CARDIOVASCULAR: Normal heart rate and rhythm, S1 and S2 normal without murmurs ABDOMEN: Soft, non-tender, non-distended, without organomegaly, Normal bowel sounds EXTREMITIES: No cyanosis or edema NEUROLOGICAL: Cranial nerves grossly intact, Moves all extremities without gross motor or sensory deficit PSYCH: Anxious   Physical Exam  No results found.  No results found for this or any previous visit (from the past 2160 hours).      Beverley Adine Hummer, MD, MS

## 2023-10-11 ENCOUNTER — Telehealth: Payer: Self-pay

## 2023-10-11 NOTE — Telephone Encounter (Signed)
 Copied from CRM [id number]. Topic: Clinical - Medication Question >> Oct 10, 2023  4:47 PM Chiquita SQUIBB wrote: Reason for CRM: Patient is calling stating he has been taking the medication and has been feeling bad since he has been taking it. Patient would like to verify if he is taking it correctly. He stated his anxiety has heightened and he feels horrible, has knots in his stomach

## 2023-10-11 NOTE — Telephone Encounter (Signed)
 Called patient to ask what medication he is referring that to that is making him feel bad. He stated the anxiety medication when reviewing his medications with him I asked if he is referring to the Duloxetine  (Cymbalta ) 30 mg capsules to be taken 1 capsule by mouth daily for 7 days and then 2 capsules (60 mg) by mouth daily. He stated that it makes his stomach feel like he has knots, he doesn't feel like himself since taking this medication. I informed him that I will be passing this to Dr. Sebastian and get back with him. He thanked me for calling.

## 2023-10-12 ENCOUNTER — Telehealth: Payer: Self-pay

## 2023-10-12 NOTE — Telephone Encounter (Signed)
 Noted. Dm/cma

## 2023-10-27 ENCOUNTER — Other Ambulatory Visit: Payer: Self-pay | Admitting: Family Medicine

## 2023-10-27 DIAGNOSIS — F419 Anxiety disorder, unspecified: Secondary | ICD-10-CM

## 2023-11-14 ENCOUNTER — Encounter: Payer: Self-pay | Admitting: Physical Medicine and Rehabilitation

## 2023-11-14 ENCOUNTER — Ambulatory Visit: Admitting: Physical Medicine and Rehabilitation

## 2023-11-14 DIAGNOSIS — M7918 Myalgia, other site: Secondary | ICD-10-CM

## 2023-11-14 DIAGNOSIS — G8929 Other chronic pain: Secondary | ICD-10-CM | POA: Diagnosis not present

## 2023-11-14 DIAGNOSIS — M47817 Spondylosis without myelopathy or radiculopathy, lumbosacral region: Secondary | ICD-10-CM

## 2023-11-14 DIAGNOSIS — M545 Low back pain, unspecified: Secondary | ICD-10-CM

## 2023-11-14 MED ORDER — METHOCARBAMOL 500 MG PO TABS: 500.0000 mg | ORAL_TABLET | Freq: Three times a day (TID) | ORAL | 0 refills | Status: DC

## 2023-11-14 MED ORDER — MELOXICAM 15 MG PO TABS: 15.0000 mg | ORAL_TABLET | Freq: Every day | ORAL | 0 refills | Status: DC

## 2023-11-14 NOTE — Progress Notes (Unsigned)
 Jamie Day - 48 y.o. male MRN [id number]  Date of birth: Mar 11, 1976  Office Visit Note: Visit Date: 11/14/2023 PCP: Sebastian Beverley NOVAK, MD Referred by: Sebastian Beverley NOVAK, MD  Subjective: Chief Complaint  Patient presents with   Lower Back - Pain   HPI: Jamie Day is a 48 y.o. male who comes in today per the request of Dr. Beverley Sebastian for evaluation of chronic, worsening and severe bilateral lower back pain. Pain ongoing for several years, worsens with movement and activity. Prolonged standing and walking also causes severe pain. He describes pain as deep soreness, currently 5 out of 10. Some relief of pain with home exercise regimen, ice/heat, massage therapy, rest and use of medications. No history of formal physical therapy. 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 There is grade 1 anterolisthesis of L5 on S1. No history of lumbar surgery/injection. He is currently working as independent Naval architect. Patient denies focal weakness, numbness and tingling. No recent trauma or falls.      Review of Systems  Musculoskeletal:  Positive for back pain and myalgias.  Neurological:  Negative for tingling, sensory change, focal weakness and weakness.  All other systems reviewed and are negative.  Otherwise per HPI.  Assessment & Plan: Visit Diagnoses:    ICD-10-CM   1. Chronic bilateral low back pain without sciatica  M54.50    G89.29     2. Spondylosis without myelopathy or radiculopathy, lumbosacral region  M47.817        Plan: Findings:  Chronic, worsening and severe bilateral lower back pain. No radicular symptoms down the legs. Patient continues to have severe pain despite good conservative therapies such as home exercise regimen, rest and use of medications. Patients clinical presentation and exam are consistent with facet mediated pain. I also feel there is a myofascial component contributing to his  pain. Myofascial tenderness noted to bilateral lumbar paraspinal regions upon palpation. We discussed treatment plan in detail today, next step is to place order for lumbar MRI imaging. I discussed medication management and prescribed Mobic  and Robaxin  for him to try. I also placed referral for short course of formal physical therapy as I feel he would benefit from core strengthening and manual treatments. I will see patient back for lumbar MRI review. No red flag symptoms noted upon exam today.     Meds & Orders: No orders of the defined types were placed in this encounter.  No orders of the defined types were placed in this encounter.   Follow-up: Return for Lumbar MRI review.   Procedures: No procedures performed      Clinical History: No specialty comments available.   He reports that he has quit smoking. His smoking use included cigarettes. He has a 10 pack-year smoking history. He has never used smokeless tobacco. No results for input(s): HGBA1C, LABURIC in the last 8760 hours.  Objective:  VS:  HT:    WT:   BMI:     BP:   HR: bpm  TEMP: ( )  RESP:  Physical Exam Vitals and nursing note reviewed.  HENT:     Head: Normocephalic and atraumatic.     Right Ear: External ear normal.     Left Ear: External ear normal.     Nose: Nose normal.     Mouth/Throat:     Mouth: Mucous membranes are moist.  Eyes:     Extraocular Movements: Extraocular movements intact.  Cardiovascular:     Rate and Rhythm: Normal rate.     Pulses: Normal pulses.  Pulmonary:     Effort: Pulmonary effort is normal.  Abdominal:     General: Abdomen is flat. There is no distension.  Musculoskeletal:        General: Tenderness present.     Cervical back: Normal range of motion.     Comments: Patient rises from seated position to standing without difficulty. Pain noted with facet loading and lumbar extension. 5/5 strength noted with bilateral hip flexion, knee flexion/extension, ankle  dorsiflexion/plantarflexion and EHL. No clonus noted bilaterally. No pain upon palpation of greater trochanters. No pain with internal/external rotation of bilateral hips. Sensation intact bilaterally. Myofascial tenderness noted to bilateral lumbar paraspinal regions upon palpation. Negative slump test bilaterally. Ambulates without aid, gait steady.     Skin:    General: Skin is warm and dry.     Capillary Refill: Capillary refill takes less than 2 seconds.  Neurological:     General: No focal deficit present.     Mental Status: He is alert and oriented to person, place, and time.  Psychiatric:        Mood and Affect: Mood normal.        Behavior: Behavior normal.     Ortho Exam  Imaging: No results found.  Past Medical/Family/Surgical/Social History: Medications & Allergies reviewed per EMR, new medications updated. There are no active problems to display for this patient.  Past Medical History:  Diagnosis Date   Acid reflux    History reviewed. No pertinent family history. History reviewed. No pertinent surgical history. Social History   Occupational History   Not on file  Tobacco Use   Smoking status: Former    Current packs/day: 0.50    Average packs/day: 0.5 packs/day for 20.0 years (10.0 ttl pk-yrs)    Types: Cigarettes   Smokeless tobacco: Never  Vaping Use   Vaping status: Never Used  Substance and Sexual Activity   Alcohol use: No   Drug use: No   Sexual activity: Yes    Partners: Female

## 2023-11-14 NOTE — Progress Notes (Unsigned)
 Pain Scale   Average Pain 3 Patient advising he has chronic lower back pain  20+ years and he advises when he is moving at work pain increases and when he uses heat and Ice pain decreases.        +Driver, -BT, -Dye Allergies.

## 2023-11-17 ENCOUNTER — Encounter: Admitting: Family Medicine

## 2023-12-07 ENCOUNTER — Encounter: Payer: Self-pay | Admitting: Family Medicine

## 2023-12-07 ENCOUNTER — Ambulatory Visit (INDEPENDENT_AMBULATORY_CARE_PROVIDER_SITE_OTHER): Admitting: Family Medicine

## 2023-12-07 ENCOUNTER — Other Ambulatory Visit: Payer: Self-pay | Admitting: Family Medicine

## 2023-12-07 VITALS — BP 120/82 | HR 64 | Temp 97.0°F | Resp 18 | Ht 77.0 in | Wt 185.4 lb

## 2023-12-07 DIAGNOSIS — G8929 Other chronic pain: Secondary | ICD-10-CM | POA: Insufficient documentation

## 2023-12-07 DIAGNOSIS — Z136 Encounter for screening for cardiovascular disorders: Secondary | ICD-10-CM

## 2023-12-07 DIAGNOSIS — F5105 Insomnia due to other mental disorder: Secondary | ICD-10-CM | POA: Insufficient documentation

## 2023-12-07 DIAGNOSIS — H00014 Hordeolum externum left upper eyelid: Secondary | ICD-10-CM | POA: Diagnosis not present

## 2023-12-07 DIAGNOSIS — M5441 Lumbago with sciatica, right side: Secondary | ICD-10-CM | POA: Diagnosis not present

## 2023-12-07 DIAGNOSIS — M5442 Lumbago with sciatica, left side: Secondary | ICD-10-CM

## 2023-12-07 DIAGNOSIS — Z125 Encounter for screening for malignant neoplasm of prostate: Secondary | ICD-10-CM | POA: Diagnosis not present

## 2023-12-07 DIAGNOSIS — R739 Hyperglycemia, unspecified: Secondary | ICD-10-CM | POA: Insufficient documentation

## 2023-12-07 DIAGNOSIS — F419 Anxiety disorder, unspecified: Secondary | ICD-10-CM

## 2023-12-07 DIAGNOSIS — Z13228 Encounter for screening for other metabolic disorders: Secondary | ICD-10-CM

## 2023-12-07 DIAGNOSIS — Z130 Encounter for screening for diseases of the blood and blood-forming organs and certain disorders involving the immune mechanism: Secondary | ICD-10-CM

## 2023-12-07 DIAGNOSIS — M7918 Myalgia, other site: Secondary | ICD-10-CM

## 2023-12-07 DIAGNOSIS — Z131 Encounter for screening for diabetes mellitus: Secondary | ICD-10-CM | POA: Diagnosis not present

## 2023-12-07 DIAGNOSIS — Z114 Encounter for screening for human immunodeficiency virus [HIV]: Secondary | ICD-10-CM

## 2023-12-07 DIAGNOSIS — M153 Secondary multiple arthritis: Secondary | ICD-10-CM | POA: Insufficient documentation

## 2023-12-07 DIAGNOSIS — Z1159 Encounter for screening for other viral diseases: Secondary | ICD-10-CM

## 2023-12-07 DIAGNOSIS — F17200 Nicotine dependence, unspecified, uncomplicated: Secondary | ICD-10-CM | POA: Insufficient documentation

## 2023-12-07 DIAGNOSIS — Z0001 Encounter for general adult medical examination with abnormal findings: Secondary | ICD-10-CM

## 2023-12-07 DIAGNOSIS — Z0000 Encounter for general adult medical examination without abnormal findings: Secondary | ICD-10-CM

## 2023-12-07 DIAGNOSIS — E876 Hypokalemia: Secondary | ICD-10-CM | POA: Insufficient documentation

## 2023-12-07 MED ORDER — METHOCARBAMOL 1000 MG PO TABS: 1000.0000 mg | ORAL_TABLET | Freq: Four times a day (QID) | ORAL | 3 refills | Status: DC

## 2023-12-07 MED ORDER — AMITRIPTYLINE HCL 10 MG PO TABS: 10.0000 mg | ORAL_TABLET | Freq: Every day | ORAL | 3 refills | Status: AC

## 2023-12-07 MED ORDER — ERYTHROMYCIN 5 MG/GM OP OINT: 1.0000 | TOPICAL_OINTMENT | Freq: Four times a day (QID) | OPHTHALMIC | 0 refills | Status: AC

## 2023-12-07 MED ORDER — DICLOFENAC SODIUM 1 % EX GEL: 4.0000 g | Freq: Four times a day (QID) | CUTANEOUS | 3 refills | Status: AC | PRN

## 2023-12-07 MED ORDER — PREGABALIN 75 MG PO CAPS: 75.0000 mg | ORAL_CAPSULE | Freq: Two times a day (BID) | ORAL | 5 refills | Status: AC

## 2023-12-07 MED ORDER — MELOXICAM 15 MG PO TABS: 15.0000 mg | ORAL_TABLET | Freq: Every day | ORAL | 3 refills | Status: AC

## 2023-12-07 NOTE — Progress Notes (Signed)
 Assessment  Assessment/Plan:  Assessment and Plan Assessment & Plan Adult Wellness Visit Routine adult wellness visit for a 48 year old [place] American male. Screening for various conditions due to age and risk factors. 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 Current treatment includes meloxicam  and methocarbamol . Steroids provided significant relief but are not a long-term solution due to potential side effects. - Continue meloxicam  50 mg oral daily - Continue methocarbamol  500 mg oral three times daily - Refer to orthopedics for further evaluation with MRI - Follow up with orthopedics for MRI results  Chronic myofascial pain syndrome Chronic myofascial pain syndrome contributing to overall pain. Current management includes physical therapy and medication. - Continue physical therapy - Consider gabapentin or amitriptyline  for pain management  Chronic post-traumatic osteoarthritis of multiple joints Chronic post-traumatic osteoarthritis causing pain in multiple joints, including knee and wrist. Current management includes meloxicam . - Prescribe diclofenac  (Voltaren ) gel for knee pain - Consider hyaluronic acid injections for knee pain - Continue meloxicam  50 mg oral daily  Chronic anxiety with insomnia Chronic anxiety with insomnia. Previous trial of duloxetine  was discontinued due to adverse effects, including vivid dreams and increased anxiety. Exploring  alternative medications for anxiety and sleep. - Prescribe amitriptyline  10 mg QHS for sleep and pain - Prescribe pregabalin  75 mg BID for anxiety and pain - Consider increasing methocarbamol  if needed for muscle spasms  Left upper eyelid hordeolum (stye) Left upper eyelid hordeolum causing swelling and pain. Limited relief from warm compresses and moisturizing eye drops. - Prescribe erythromycin  ointment for topical application to the eyelid - Continue warm compresses     Medications Discontinued During This Encounter  Medication Reason   amoxicillin  (AMOXIL ) 500 MG capsule    methylPREDNISolone  (MEDROL  DOSEPAK) 4 MG TBPK tablet    ondansetron  (ZOFRAN  ODT) 8 MG disintegrating tablet    promethazine  (PHENERGAN ) 25 MG tablet    LORazepam  (ATIVAN ) 0.5 MG tablet    DULoxetine  (CYMBALTA ) 30 MG capsule    meloxicam  (MOBIC ) 15 MG tablet Reorder   methocarbamol  (ROBAXIN ) 500 MG tablet     Patient Counseling(The following topics were reviewed and/or handout was given):  -Nutrition: Stressed importance of moderation in sodium/caffeine intake, saturated fat and cholesterol, caloric balance, sufficient intake of fresh fruits, vegetables, and fiber.  -Stressed the importance of regular exercise.   -Substance Abuse: Discussed cessation/primary prevention of tobacco, alcohol, or other drug use; driving or other dangerous activities under the influence; availability of treatment for abuse.   -Injury prevention: Discussed safety belts, safety helmets, smoke detector, smoking near bedding or upholstery.   -Sexuality: Discussed sexually transmitted diseases, partner selection, use of condoms, avoidance of unintended pregnancy and contraceptive alternatives.   -Dental health: Discussed importance of regular tooth brushing, flossing, and dental visits.  -Health maintenance and immunizations reviewed. Please refer to Health maintenance section.  Return in about 3 months (around 03/08/2024) for pain,  anxiety, insomnia.        Subjective:   Encounter date: 12/07/2023  Chief Complaint  Patient presents with   Annual Exam  Pt is not fasting today   HM due- Hep B vaccine ( patient declined) colonoscopy done 2-3 years ago.    Stye    Pt c/o of stye on left upper eyelid for 3 days; very painful. Pt used heat compress for symptoms    Anxiety   Muscle Pain    Pt c/o of muscle and joint pain in knees, back and wrist for a few years. Pt used ice/heat and mobic  15MG  and massage therapy but didn't help much.     Discussed the use of AI scribe software for clinical note transcription with the patient, who gave verbal consent to proceed.  History of Present Illness Jamie Day is a 48 year old male with anxiety and chronic lower back pain who presents for follow-up and annual physical.  He experiences chronic lower back pain with sciatica and spondylitis. He reports taking meloxicam  and methocarbamol  for pain management. Massage therapy and steroids have been used for pain management. He also reports pain in his wrist and knee, describing the knee pain as a 'deep ache' that is not relieved by icing. He has received numerous knee injections in the past.  He was started on duloxetine  30 mg, titrated to 60 mg daily for anxiety and lower back pain, but discontinued it after experiencing 'weird dreams' and increased anxiety. His anxiety is typically worse before trips but improved after stopping duloxetine . He has a history of chronic anxiety and reports difficulty sleeping, typically sleeping only five hours a night.  He reports swelling and pain in the left upper eyelid, which he attributes to a sty. He has been using warm compresses and moisturizing eye drops but finds them ineffective.  He has a history of post-traumatic osteoarthritis of multiple joints. His wrist and knee pain are particularly problematic, with the wrist pain affecting his grip and stability.  He has a history of  mild hypercalcemia, hyperglycemia, and hypokalemia from a lab test done ten years ago. He is due for a metabolic panel to reassess these levels.       12/07/2023    3:05 PM 10/04/2023    2:26 PM 12/16/2015    9:56 AM  Depression screen PHQ 2/9  Decreased Interest 1 2 0  Down, Depressed, Hopeless 0 1 0  PHQ - 2 Score 1 3 0  Altered sleeping 1 1   Tired, decreased energy 0 1   Change in appetite 0 0   Feeling bad or failure about yourself  0 0   Trouble concentrating 0 0   Moving slowly or fidgety/restless 0 0   Suicidal thoughts 0 0   PHQ-9 Score 2 5   Difficult doing work/chores Not difficult at all Not difficult at all        12/07/2023    3:05 PM 10/04/2023    2:26 PM  GAD 7 : Generalized Anxiety Score  Nervous, Anxious, on Edge 0 1  Control/stop worrying 1 1  Worry too much - different things 1 1  Trouble relaxing 1 1  Restless 0 0  Easily annoyed or irritable 0 0  Afraid - awful might happen 0 0  Total GAD 7 Score 3 4  Anxiety Difficulty Somewhat difficult Not difficult at all    Health Maintenance Due  Topic Date Due   HIV Screening  Never done   Hepatitis C Screening  Never done   Colonoscopy  Never done   COVID-19 Vaccine (1 - 2024-25 season) Never done   INFLUENZA VACCINE  11/11/2023      PMH:  The following were reviewed and entered/updated in epic: Past Medical History:  Diagnosis Date   Acid reflux     Patient Active Problem List   Diagnosis Date Noted   Hypercalcemia 12/07/2023   Anxiety 12/07/2023   Chronic bilateral low back pain with bilateral sciatica 12/07/2023   Myofascial pain syndrome of lumbar spine 12/07/2023   Hyperglycemia 12/07/2023   Hypokalemia 12/07/2023   Post-traumatic osteoarthritis of multiple joints 12/07/2023   Hordeolum externum left upper eyelid 12/07/2023   Insomnia due to other mental disorder 12/07/2023   Tobacco use disorder, mild, abuse 12/07/2023    History reviewed. No pertinent surgical history.  History  reviewed. No pertinent family history.  Medications- reviewed and updated Outpatient Medications Prior to Visit  Medication Sig Dispense Refill   esomeprazole (NEXIUM) 20 MG capsule Take 20 mg by mouth daily.     LORazepam  (ATIVAN ) 0.5 MG tablet Take 1 tablet (0.5 mg total) by mouth every 8 (eight) hours. 30 tablet 0   meloxicam  (MOBIC ) 15 MG tablet Take 1 tablet (15 mg total) by mouth daily. 30 tablet 0   methocarbamol  (ROBAXIN ) 500 MG tablet Take 1 tablet (500 mg total) by mouth 3 (three) times daily. 90 tablet 0   methylPREDNISolone  (MEDROL  DOSEPAK) 4 MG TBPK tablet Take per package instructions. 21 tablet 0   ondansetron  (ZOFRAN  ODT) 8 MG disintegrating tablet Take 1 tablet (8 mg total) by mouth every 8 (eight) hours as needed for nausea or vomiting. 20 tablet 1   promethazine  (PHENERGAN ) 25 MG tablet Take 1 tablet (25 mg total) by mouth every 6 (six) hours as needed for nausea or vomiting. 30 tablet 0   amoxicillin  (AMOXIL ) 500 MG capsule Take 1 capsule (500 mg total) by mouth 3 (three) times daily. (Patient not taking: Reported on 12/07/2023) 21 capsule 0   DULoxetine  (CYMBALTA ) 30 MG capsule TAKE 1 CAPSULE BY MOUTH DAILY FOR 7 DAYS, THEN 2 CAPSULES BY MOUTH DAILY (Patient not taking: Reported on 12/07/2023) 180 capsule 2   No facility-administered medications prior to visit.    Allergies  Allergen Reactions   Duloxetine  Anxiety    Social History   Socioeconomic History   Marital status: Married    Spouse name: Not on file   Number of children: Not on file   Years of education: Not on file   Highest education level: Not on file  Occupational History   Not on file  Tobacco Use   Smoking status: Former    Current packs/day: 0.50    Average packs/day: 0.5 packs/day for 20.0 years (10.0 ttl pk-yrs)    Types: Cigarettes   Smokeless tobacco: Never   Tobacco comments:    Patient smoke 1 cigarette per day   Vaping Use   Vaping status: Never Used  Substance and Sexual Activity    Alcohol use: No   Drug use: No   Sexual activity: Yes    Partners: Female    Birth control/protection: None  Other Topics Concern   Not on file  Social History Narrative   Married   1 son   Social Drivers of Corporate investment banker Strain: Not on file  Food Insecurity: Not on file  Transportation Needs: Not on file  Physical Activity: Not on file  Stress: Not on file  Social Connections: Not on file           Objective:  Physical Exam: BP 95/60 (BP Location: Left Arm, Patient  Position: Sitting, Cuff Size: Normal)   Pulse 64   Temp (!) 97 F (36.1 C) (Temporal)   Resp 18   Ht 6' 5 (1.956 m)   Wt 185 lb 6.4 oz (84.1 kg)   SpO2 100%   BMI 21.99 kg/m   Body mass index is 21.99 kg/m. Wt Readings from Last 3 Encounters:  12/07/23 185 lb 6.4 oz (84.1 kg)  10/04/23 180 lb 3.2 oz (81.7 kg)  01/31/17 200 lb (90.7 kg)    Physical Exam GENERAL: Alert, cooperative, well developed, no acute distress HEENT: Normocephalic, normal oropharynx, moist mucous membranes, stye on left upper eyelid with infection CHEST: Clear to auscultation bilaterally, no wheezes, rhonchi, or crackles CARDIOVASCULAR: Normal heart rate and rhythm, S1 and S2 normal without murmurs ABDOMEN: Soft, non-tender, non-distended, without organomegaly, normal bowel sounds EXTREMITIES: No cyanosis or edema NEUROLOGICAL: Cranial nerves grossly intact, moves all extremities without gross motor or sensory deficit  Physical Exam      Prior labs:   No results found for this or any previous visit (from the past 2160 hours).  No results found for: CHOL No results found for: HDL No results found for: LDLCALC No results found for: TRIG No results found for: CHOLHDL No results found for: LDLDIRECT  Last metabolic panel Lab Results  Component Value Date   GLUCOSE 109 (H) 01/17/2013   NA 140 01/17/2013   K 3.4 (L) 01/17/2013   CL 104 01/17/2013   BUN 9 01/17/2013   CREATININE 1.10  01/17/2013    No results found for: HGBA1C  Last CBC Lab Results  Component Value Date   HGB 15.3 01/17/2013   HCT 45.0 01/17/2013    No results found for: TSH  No results found for: PSA1, PSA  Last vitamin D No results found for: 25OHVITD2, 25OHVITD3, VD25OH  No results found for: COLORU, CLARITYU, GLUCOSEUR, BILIRUBINUR, KETONESU, SPECGRAV, RBCUR, PHUR, PROTEINUR, UROBILINOGEN, LEUKOCYTESUR  No results found for: LABMICR, MICROALBUR   At today's visit, we discussed treatment options, associated risk and benefits, and engage in counseling as needed.  Additionally the following were reviewed: Past medical records, past medical and surgical history, family and social background, as well as relevant laboratory results, imaging findings, and specialty notes, where applicable.  This message was generated using dictation software, and as a result, it may contain unintentional typos or errors.  Nevertheless, extensive effort was made to accurately convey at the pertinent aspects of the patient visit.    There may have been are other unrelated non-urgent complaints, but due to the busy schedule and the amount of time already spent with him, time does not permit to address these issues at today's visit. Another appointment may have or has been requested to review these additional issues.     Arvella Hummer, MD, MS

## 2023-12-07 NOTE — Patient Instructions (Signed)
  VISIT SUMMARY: You had a follow-up appointment and annual physical today. We discussed your chronic lower back pain, anxiety, wrist and knee pain, and a new issue with your left upper eyelid. We also reviewed your general health and ordered several routine tests to monitor your overall well-being.  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 Continue taking meloxicam  50 mg daily and methocarbamol  500 mg three times daily. You will be referred to orthopedics for further evaluation with an MRI, and we will follow up with the results.  -CHRONIC MYOFASCIAL PAIN SYNDROME: Chronic myofascial pain syndrome is a condition that causes pain in the muscles and surrounding tissues. Continue with physical therapy. We may consider adding gabapentin or amitriptyline  for pain management if needed.  -CHRONIC POST-TRAUMATIC OSTEOARTHRITIS OF MULTIPLE JOINTS: Chronic post-traumatic osteoarthritis is a type of arthritis that occurs after an injury, causing joint pain and stiffness. Continue taking meloxicam  50 mg daily. We will prescribe diclofenac  (Voltaren ) gel for your knee pain and consider hyaluronic acid injections if necessary.  -CHRONIC ANXIETY WITH INSOMNIA: Chronic anxiety with insomnia is a long-term condition that causes persistent worry and difficulty sleeping. We will prescribe amitriptyline  10 mg at bedtime for sleep and pain, and pregabalin  75 mg twice daily for anxiety and pain. If needed, we may increase your methocarbamol  dosage for muscle spasms.  -LEFT UPPER EYELID HORDEOLUM (STYE): A hordeolum, or stye, is a painful, red bump on the eyelid caused by a bacterial infection. We will prescribe erythromycin  ointment for topical application to your eyelid and recommend continuing with warm compresses.  -GENERAL  HEALTH MAINTENANCE: We will conduct several routine tests to monitor your overall health, including a fasting lipid panel, metabolic panel, microalbumin and creatinine ratio, urinalysis, hepatitis C screen, HIV screen, and PSA test. You will also be referred to gastroenterology for a screening colonoscopy.  INSTRUCTIONS: Please follow up with orthopedics for your MRI results. Make sure to complete the lab tests and screening as discussed. Continue with your current medications and start the new prescriptions as directed. If you have any questions or concerns, please contact our office.

## 2023-12-08 LAB — CBC WITH DIFFERENTIAL/PLATELET
Basophils Absolute: 0 K/uL (ref 0.0–0.1)
Basophils Relative: 1.2 % (ref 0.0–3.0)
Eosinophils Absolute: 0.2 K/uL (ref 0.0–0.7)
Eosinophils Relative: 6.4 % — ABNORMAL HIGH (ref 0.0–5.0)
HCT: 41.5 % (ref 39.0–52.0)
Hemoglobin: 13.3 g/dL (ref 13.0–17.0)
Lymphocytes Relative: 36.6 % (ref 12.0–46.0)
Lymphs Abs: 1.4 K/uL (ref 0.7–4.0)
MCHC: 32.1 g/dL (ref 30.0–36.0)
MCV: 85.7 fl (ref 78.0–100.0)
Monocytes Absolute: 0.3 K/uL (ref 0.1–1.0)
Monocytes Relative: 6.9 % (ref 3.0–12.0)
Neutro Abs: 1.9 K/uL (ref 1.4–7.7)
Neutrophils Relative %: 48.9 % (ref 43.0–77.0)
Platelets: 172 K/uL (ref 150.0–400.0)
RBC: 4.84 Mil/uL (ref 4.22–5.81)
RDW: 14.7 % (ref 11.5–15.5)
WBC: 3.8 K/uL — ABNORMAL LOW (ref 4.0–10.5)

## 2023-12-08 LAB — TSH: TSH: 1.03 u[IU]/mL (ref 0.35–5.50)

## 2023-12-08 LAB — LIPID PANEL
Cholesterol: 176 mg/dL (ref 0–200)
HDL: 58.8 mg/dL (ref >39.00)
LDL Cholesterol: 96 mg/dL (ref 0–99)
NonHDL: 117.19
Total CHOL/HDL Ratio: 3
Triglycerides: 107 mg/dL (ref 0.0–149.0)
VLDL: 21.4 mg/dL (ref 0.0–40.0)

## 2023-12-08 LAB — COMPREHENSIVE METABOLIC PANEL WITH GFR
ALT: 21 U/L (ref 0–53)
AST: 19 U/L (ref 0–37)
Albumin: 4.1 g/dL (ref 3.5–5.2)
Alkaline Phosphatase: 50 U/L (ref 39–117)
BUN: 13 mg/dL (ref 6–23)
CO2: 23 meq/L (ref 19–32)
Calcium: 8.8 mg/dL (ref 8.4–10.5)
Chloride: 104 meq/L (ref 96–112)
Creatinine, Ser: 1.03 mg/dL (ref 0.40–1.50)
GFR: 86.15 mL/min (ref >60.00)
Glucose, Bld: 87 mg/dL (ref 70–99)
Potassium: 4.3 meq/L (ref 3.5–5.1)
Sodium: 137 meq/L (ref 135–145)
Total Bilirubin: 0.4 mg/dL (ref 0.2–1.2)
Total Protein: 7.3 g/dL (ref 6.0–8.3)

## 2023-12-08 LAB — HCV AB W REFLEX TO QUANT PCR: HCV Ab: NONREACTIVE

## 2023-12-08 LAB — MICROALBUMIN / CREATININE URINE RATIO
Creatinine,U: 225.8 mg/dL
Microalb Creat Ratio: 10.9 mg/g (ref 0.0–30.0)
Microalb, Ur: 2.5 mg/dL — ABNORMAL HIGH (ref 0.0–1.9)

## 2023-12-08 LAB — PSA: PSA: 0.53 ng/mL (ref 0.10–4.00)

## 2023-12-08 LAB — HEMOGLOBIN A1C: Hgb A1c MFr Bld: 5.9 % (ref 4.6–6.5)

## 2023-12-08 LAB — HCV INTERPRETATION

## 2023-12-09 NOTE — Therapy (Incomplete)
 OUTPATIENT PHYSICAL THERAPY THORACOLUMBAR EVALUATION   Patient Name: Jamie Day MRN: [id number] DOB:21-Jun-1975, 48 y.o., male Today's Date: 12/09/2023  END OF SESSION:***   Past Medical History:  Diagnosis Date   Acid reflux    No past surgical history on file. Patient Active Problem List   Diagnosis Date Noted   Hypercalcemia 12/07/2023   Anxiety 12/07/2023   Chronic bilateral low back pain with bilateral sciatica 12/07/2023   Myofascial pain syndrome of lumbar spine 12/07/2023   Hyperglycemia 12/07/2023   Hypokalemia 12/07/2023   Post-traumatic osteoarthritis of multiple joints 12/07/2023   Hordeolum externum left upper eyelid 12/07/2023   Insomnia due to other mental disorder 12/07/2023   Tobacco use disorder, mild, abuse 12/07/2023    PCP: Sebastian Beverley NOVAK, MD   REFERRING PROVIDER: Trudy Duwaine BRAVO, NP   REFERRING DIAG: [phone] (ICD-10-CM) - Chronic bilateral low back pain without sciatica M47.817 (ICD-10-CM) - Spondylosis without myelopathy or radiculopathy, lumbosacral region M79.18 (ICD-10-CM) - Myofascial pain syndrome   Rationale for Evaluation and Treatment: Rehabilitation  THERAPY DIAG:  No diagnosis found.  ONSET DATE: chronic  SUBJECTIVE:                                                                                                                                                                                           SUBJECTIVE STATEMENT: ***  PERTINENT HISTORY:  ***  PAIN:  Are you having pain? Yes: NPRS scale: *** Pain location: *** Pain description: *** Aggravating factors: *** Relieving factors: ***  PRECAUTIONS: {Therapy precautions:24002}  RED FLAGS: {PT Red Flags:29287}   WEIGHT BEARING RESTRICTIONS: No  FALLS:  Has patient fallen in last 6 months? {fallsyesno:27318}  LIVING ENVIRONMENT: Lives with: {[facility] lives with:25569::lives with their family} Lives in: {Lives in:25570} Stairs: {opstairs:27293} Has  following equipment at home: {Assistive devices:23999}  OCCUPATION: ***  PLOF: {PLOF:24004}  PATIENT GOALS: ***  NEXT MD VISIT: 03/07/24  OBJECTIVE:  Note: Objective measures were completed at Evaluation unless otherwise noted.  DIAGNOSTIC FINDINGS:  MRI pending   PATIENT SURVEYS:  PSFS: THE PATIENT SPECIFIC FUNCTIONAL SCALE  Place score of 0-10 (0 = unable to perform activity and 10 = able to perform activity at the same level as before injury or problem)  Activity Date: 12/13/23         2.     3.     4.      Total Score ***      Total Score = Sum of activity scores/number of activities  Minimally Detectable Change: 3 points (for single activity); 2 points (for average score)  [facility] (nd). The Patient Specific  Functional Scale . Retrieved from [email or web address]   COGNITION: Overall cognitive status: Within functional limits for tasks assessed     SENSATION: {sensation:27233}  MUSCLE LENGTH: Hamstrings: Right *** deg; Left *** deg Debby test: Right *** deg; Left *** deg  POSTURE: {posture:25561}  PALPATION: ***  LUMBAR ROM:   AROM eval  Flexion   Extension   Right lateral flexion   Left lateral flexion   Right rotation   Left rotation    (Blank rows = not tested)  LOWER EXTREMITY ROM:     {AROM/PROM:27142}  Right eval Left eval  Hip flexion    Hip extension    Hip abduction    Hip adduction    Hip internal rotation    Hip external rotation    Knee flexion    Knee extension    Ankle dorsiflexion    Ankle plantarflexion    Ankle inversion    Ankle eversion     (Blank rows = not tested)  LOWER EXTREMITY MMT:    MMT Right eval Left eval  Hip flexion    Hip extension    Hip abduction    Hip adduction    Hip internal rotation    Hip external rotation    Knee flexion    Knee extension    Ankle dorsiflexion    Ankle plantarflexion    Ankle inversion    Ankle  eversion     (Blank rows = not tested)  LUMBAR SPECIAL TESTS:  Straight leg raise test: {pos/neg:25243}  FUNCTIONAL TESTS:  5 times sit to stand: ***  GAIT: Distance walked: *** Assistive device utilized: {Assistive devices:23999} Level of assistance: {Levels of assistance:24026} Comments: ***  TREATMENT DATE: ***                                                                                                                                 PATIENT EDUCATION:  Education details: HEP Person educated: {Person educated:25204} Education method: Programmer, multimedia, Facilities manager, Actor cues, and Verbal cues Education comprehension: verbalized understanding and returned demonstration  HOME EXERCISE PROGRAM: ***  ASSESSMENT:  CLINICAL IMPRESSION: Patient is a 48 y.o. male who was seen today for physical therapy evaluation and treatment for ***. He presents with.....  He would benefit from skilled PT to address these deficits and return to previous LOA.  OBJECTIVE IMPAIRMENTS: {opptimpairments:25111}.   ACTIVITY LIMITATIONS: {activitylimitations:27494}  PARTICIPATION LIMITATIONS: {participationrestrictions:25113}  PERSONAL FACTORS: {Personal factors:25162} are also affecting patient's functional outcome.   REHAB POTENTIAL: {rehabpotential:25112}  CLINICAL DECISION MAKING: {clinical decision making:25114}  EVALUATION COMPLEXITY: {Evaluation complexity:25115}   GOALS: Goals reviewed with patient? Yes  SHORT TERM GOALS: Target date: ***  Pt to be independent with HEP.   Baseline: Goal status: INITIAL  2.  Decrease pain by 1 level. Baseline:  Goal status: INITIAL LONG TERM GOALS: Target date: ***  Improve AROM by  Baseline:  Goal status: INITIAL  2.  Increase strength by Baseline:  Goal status: INITIAL  3.  Improve PSFS score by 2 or more to demonstrate meaureable difference. Baseline:  Goal status: INITIAL  4.  Decrease pain to  Baseline:  Goal status:  INITIAL  5.  *** Baseline:  Goal status: INITIAL  6.  *** Baseline:  Goal status: INITIAL  PLAN:  PT FREQUENCY: 2x/week  PT DURATION: {rehab duration:25117}  PLANNED INTERVENTIONS: 97164- PT Re-evaluation, 97110-Therapeutic exercises, 97530- Therapeutic activity, 97112- Neuromuscular re-education, 97535- Self Care, 02859- Manual therapy, [id number]- Aquatic Therapy, H9716- Electrical stimulation (unattended), 97035- Ultrasound, Patient/Family education, Joint mobilization, Cryotherapy, and Moist heat.  PLAN FOR NEXT SESSION: ***   Burnard CHRISTELLA Meth, PT 12/09/2023, 9:39 AM

## 2023-12-11 LAB — URINALYSIS W MICROSCOPIC + REFLEX CULTURE
Bacteria, UA: NONE SEEN /HPF
Bilirubin Urine: NEGATIVE
Glucose, UA: NEGATIVE
Hyaline Cast: NONE SEEN /LPF
Ketones, ur: NEGATIVE
Leukocyte Esterase: NEGATIVE
Nitrites, Initial: NEGATIVE
RBC / HPF: NONE SEEN /HPF (ref 0–2)
Specific Gravity, Urine: 1.027 (ref 1.001–1.035)
Squamous Epithelial / HPF: NONE SEEN /HPF (ref <=5)
WBC, UA: NONE SEEN /HPF (ref 0–5)
pH: 6 (ref 5.0–8.0)

## 2023-12-11 LAB — VITAMIN D 1,25 DIHYDROXY
Vitamin D 1, 25 (OH)2 Total: 47 pg/mL (ref 18–72)
Vitamin D2 1, 25 (OH)2: <8 pg/mL
Vitamin D3 1, 25 (OH)2: 47 pg/mL

## 2023-12-11 LAB — HIV ANTIBODY (ROUTINE TESTING W REFLEX): HIV 1&2 Ab, 4th Generation: NONREACTIVE

## 2023-12-11 LAB — INSULIN, RANDOM: Insulin: 8.7 u[IU]/mL

## 2023-12-11 LAB — NO CULTURE INDICATED

## 2023-12-13 ENCOUNTER — Ambulatory Visit: Payer: Self-pay | Admitting: Family Medicine

## 2023-12-13 ENCOUNTER — Ambulatory Visit

## 2023-12-14 ENCOUNTER — Encounter: Payer: Self-pay | Admitting: Orthopedic Surgery

## 2023-12-18 ENCOUNTER — Ambulatory Visit
Admission: RE | Admit: 2023-12-18 | Discharge: 2023-12-18 | Disposition: A | Discharge status: Home / Self Care | Source: Ambulatory Visit | Attending: Physical Medicine and Rehabilitation | Admitting: Physical Medicine and Rehabilitation

## 2023-12-18 DIAGNOSIS — M47817 Spondylosis without myelopathy or radiculopathy, lumbosacral region: Secondary | ICD-10-CM

## 2023-12-18 DIAGNOSIS — M545 Low back pain, unspecified: Secondary | ICD-10-CM

## 2023-12-18 DIAGNOSIS — M7918 Myalgia, other site: Secondary | ICD-10-CM

## 2024-01-12 ENCOUNTER — Ambulatory Visit: Admitting: Physical Medicine and Rehabilitation

## 2024-01-12 ENCOUNTER — Encounter: Payer: Self-pay | Admitting: Physical Medicine and Rehabilitation

## 2024-01-12 DIAGNOSIS — M47817 Spondylosis without myelopathy or radiculopathy, lumbosacral region: Secondary | ICD-10-CM | POA: Diagnosis not present

## 2024-01-12 DIAGNOSIS — G8929 Other chronic pain: Secondary | ICD-10-CM

## 2024-01-12 DIAGNOSIS — M545 Low back pain, unspecified: Secondary | ICD-10-CM

## 2024-01-12 DIAGNOSIS — M7918 Myalgia, other site: Secondary | ICD-10-CM

## 2024-01-12 NOTE — Progress Notes (Signed)
 Pain Scale   Average Pain 2 Patient advising he has lower back pain. Patient is here for MRI review.        +Driver, -BT, -Dye Allergies.

## 2024-01-12 NOTE — Progress Notes (Signed)
 [name] - 48 y.o. male MRN [id number]  Date of birth: 10-10-1975  Office Visit Note: Visit Date: 01/12/2024 PCP: Sebastian Beverley NOVAK, MD Referred by: Sebastian Beverley NOVAK, MD  Subjective: Chief Complaint  Patient presents with   Lower Back - Pain   HPI: Jamie Day is a 48 y.o. male who comes in today for evaluation of chronic, worsening and severe bilateral lower back pain. Pain ongoing for several years, worsens with movement and activity. He reports pain with heavy lifting. He describes pain as sore and aching sensation, currently rates as 4 out of 10. Some relief of pain with home exercise regimen, rest, heating pad and massage therapy. He reports significant relief of pain with regular massage therapy. Recent lumbar MRI imaging shows multi level degenerative changes, grade 1 anterolisthesis of L5 on S1. Mild-to-moderate right lateral recess and neural foraminal stenosis at L3-L4. There is also facet arthropathy at L3-L4. No high grade spinal canal stenosis noted. He is currently working as independent Naval architect. Patient denies focal weakness, numbness and tingling. No recent trauma or falls.      Review of Systems  Musculoskeletal:  Positive for back pain.  Neurological:  Negative for tingling, sensory change, focal weakness and weakness.  All other systems reviewed and are negative.  Otherwise per HPI.  Assessment & Plan: Visit Diagnoses:    ICD-10-CM   1. Chronic bilateral low back pain without sciatica  M54.50    G89.29     2. Spondylosis without myelopathy or radiculopathy, lumbosacral region  M47.817     3. Myofascial pain syndrome  M79.18        Plan: Findings:  Chronic, worsening and severe bilateral lower back pain. No radicular pain down the legs. Patient continues to have pain despite good conservative therapies such as home exercise regimen, rest and use of medications. I discussed recent lumbar MRI with him today using imaging and spine model. I  explained to him that there are multi level degenerative changes consist with his age. No severe spinal canal stenosis. I do think this is more of a mechanical low back pain. We discussed treatment plan in detail. I would like him to attend short course of formal physical therapy with a focus on strengthening and home exercise regimen. I also contacted representative with [facility] [employer] to get patient fitted for lumbar brace. I think it would be beneficial for him to try this brace while working. Should his pain continue we could look at possible facet joint injections. He has no questions at this time. No red flag symptoms noted upon exam today.     Meds & Orders: No orders of the defined types were placed in this encounter.  No orders of the defined types were placed in this encounter.   Follow-up: Return if symptoms worsen or fail to improve.   Procedures: No procedures performed      Clinical History: EXAM: MRI LUMBAR SPINE 12/18/2023 08:28:38 AM   TECHNIQUE: Multiplanar multisequence MRI of the lumbar spine was performed without the administration of intravenous contrast.   COMPARISON: Lumbar spine series dated 07/30/2017.   CLINICAL HISTORY: Low back pain, symptoms persist with > 6 wks treatment. CHRONIC LBP RAD BILAT LE; NKI, NO SX, NO INJECTIONS; NO PRIOR   FINDINGS:   BONES AND ALIGNMENT: Bilateral spondylolysis and grade 1 anterolisthesis at L5-S1.   SPINAL CORD: The conus medullaris terminates at T12-L1.   SOFT TISSUES: No paraspinal mass.   L1-L2: No significant disc  herniation. No spinal canal stenosis or neural foraminal narrowing.   L2-L3: Diffuse disc bulging resulting in mild central spinal canal stenosis and moderate bilateral lateral recess stenosis. No apparent nerve root impingement.   L3-L4: Diffuse disc bulging and bilateral facet hypertrophy with mild-to-moderate central spinal canal stenosis, moderate left lateral recess stenosis, and questionable  impingement of the left L4 nerve. Mild-to-moderate right lateral recess and neural foraminal stenosis also present.   L4-L5: Slight degenerative retrolisthesis. Mild-to-moderate bilateral lateral recess stenosis, but no apparent nerve root impingement.   L5-S1: No significant disc herniation. Spinal canal and neural foramina are patent.   IMPRESSION: 1. 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 2. Mild-to-moderate central spinal canal stenosis, moderate left lateral recess stenosis with questionable impingement of the left L4 nerve, and mild-to-moderate right lateral recess and neural foraminal stenosis at L3-4. 3. Mild-to-moderate bilateral lateral recess stenosis at L4-5. No apparent nerve root impingement.   Electronically signed by: Evalene Coho MD 12/28/2023 10:01 AM EDT RP Workstation: [id number]   He reports that he has quit smoking. His smoking use included cigarettes. He has a 10 pack-year smoking history. He has never used smokeless tobacco.  Recent Labs    12/07/23 1545  HGBA1C 5.9    Objective:  VS:  HT:    WT:   BMI:     BP:   HR: bpm  TEMP: ( )  RESP:  Physical Exam Vitals and nursing note reviewed.  HENT:     Head: Normocephalic and atraumatic.     Right Ear: External ear normal.     Left Ear: External ear normal.     Nose: Nose normal.     Mouth/Throat:     Mouth: Mucous membranes are moist.  Eyes:     Extraocular Movements: Extraocular movements intact.  Cardiovascular:     Rate and Rhythm: Normal rate.     Pulses: Normal pulses.  Pulmonary:     Effort: Pulmonary effort is normal.  Abdominal:     General: Abdomen is flat. There is no distension.  Musculoskeletal:        General: Tenderness present.     Cervical back: Normal range of motion.     Comments: Patient rises from seated position to standing without difficulty. Good lumbar range of motion. No pain noted with facet loading. 5/5 strength noted with bilateral hip  flexion, knee flexion/extension, ankle dorsiflexion/plantarflexion and EHL. No clonus noted bilaterally. No pain upon palpation of greater trochanters. No pain with internal/external rotation of bilateral hips. Sensation intact bilaterally. Myofascial tenderness noted upon palpation of bilateral lumbar paraspinal regions. Negative slump test bilaterally. Ambulates without aid, gait steady.     Skin:    General: Skin is warm and dry.     Capillary Refill: Capillary refill takes less than 2 seconds.  Neurological:     General: No focal deficit present.     Mental Status: He is alert and oriented to person, place, and time.  Psychiatric:        Mood and Affect: Mood normal.        Behavior: Behavior normal.     Ortho Exam  Imaging: No results found.  Past Medical/Family/Surgical/Social History: Medications & Allergies reviewed per EMR, new medications updated. Patient Active Problem List   Diagnosis Date Noted   Hypercalcemia 12/07/2023   Anxiety 12/07/2023   Chronic bilateral low back pain with bilateral sciatica 12/07/2023   Myofascial pain syndrome of lumbar spine 12/07/2023   Hyperglycemia 12/07/2023  Hypokalemia 12/07/2023   Post-traumatic osteoarthritis of multiple joints 12/07/2023   Hordeolum externum left upper eyelid 12/07/2023   Insomnia due to other mental disorder 12/07/2023   Tobacco use disorder, mild, abuse 12/07/2023   Past Medical History:  Diagnosis Date   Acid reflux    History reviewed. No pertinent family history. History reviewed. No pertinent surgical history. Social History   Occupational History   Not on file  Tobacco Use   Smoking status: Former    Current packs/day: 0.50    Average packs/day: 0.5 packs/day for 20.0 years (10.0 ttl pk-yrs)    Types: Cigarettes   Smokeless tobacco: Never   Tobacco comments:    Patient smoke 1 cigarette per day   Vaping Use   Vaping status: Never Used  Substance and Sexual Activity   Alcohol use: No   Drug  use: No   Sexual activity: Yes    Partners: Female    Birth control/protection: None

## 2024-01-24 ENCOUNTER — Telehealth: Admitting: Physician Assistant

## 2024-01-24 DIAGNOSIS — H00016 Hordeolum externum left eye, unspecified eyelid: Secondary | ICD-10-CM | POA: Diagnosis not present

## 2024-01-24 MED ORDER — POLYMYXIN B-TRIMETHOPRIM 10000-0.1 UNIT/ML-% OP SOLN: OPHTHALMIC | 0 refills | Status: AC

## 2024-01-24 NOTE — Progress Notes (Signed)
 Virtual Visit Consent   Jamie Day, you are scheduled for a virtual visit with a [facility] provider today. Just as with appointments in the office, your consent must be obtained to participate. Your consent will be active for this visit and any virtual visit you may have with one of our providers in the next 365 days. If you have a MyChart account, a copy of this consent can be sent to you electronically.  As this is a virtual visit, video technology does not allow for your provider to perform a traditional examination. This may limit your provider's ability to fully assess your condition. If your provider identifies any concerns that need to be evaluated in person or the need to arrange testing (such as labs, EKG, etc.), we will make arrangements to do so. Although advances in technology are sophisticated, we cannot ensure that it will always work on either your end or our end. If the connection with a video visit is poor, the visit may have to be switched to a telephone visit. With either a video or telephone visit, we are not always able to ensure that we have a secure connection.  By engaging in this virtual visit, you consent to the provision of healthcare and authorize for your insurance to be billed (if applicable) for the services provided during this visit. Depending on your insurance coverage, you may receive a charge related to this service.  I need to obtain your verbal consent now. Are you willing to proceed with your visit today? Jamie Day has provided verbal consent on 01/24/2024 for a virtual visit (video or telephone). Jamie Day, [place]  Date: 01/24/2024 6:58 PM   Virtual Visit via Video Note   I, Jamie Day, connected with  Jamie Day  ([id number], 08-21-1975) on 01/24/24 at  6:30 PM EDT by a video-enabled telemedicine application and verified that I am speaking with the correct person using two identifiers.  Location: Patient: Virtual Visit  Location Patient: Home Provider: Virtual Visit Location Provider: Home Office   I discussed the limitations of evaluation and management by telemedicine and the availability of in person appointments. The patient expressed understanding and agreed to proceed.    History of Present Illness: Jamie Day is a 48 y.o. who identifies as a male who was assigned male at birth, and is being seen today for continued stye of L upper eyelid over past month or so. Was initially evaluated by his PCP and started on Erythromycin  drops with improvement. Notes stye never fully resolved but has been asymptomatic until the past week or so, with increase in size and mild discomfort. Denies fever, chills, vision changes.   HPI: HPI  Problems:  Patient Active Problem List   Diagnosis Date Noted   Hypercalcemia 12/07/2023   Anxiety 12/07/2023   Chronic bilateral low back pain with bilateral sciatica 12/07/2023   Myofascial pain syndrome of lumbar spine 12/07/2023   Hyperglycemia 12/07/2023   Hypokalemia 12/07/2023   Post-traumatic osteoarthritis of multiple joints 12/07/2023   Hordeolum externum left upper eyelid 12/07/2023   Insomnia due to other mental disorder 12/07/2023   Tobacco use disorder, mild, abuse 12/07/2023    Allergies:  Allergies  Allergen Reactions   Duloxetine  Anxiety   Medications:  Current Outpatient Medications:    trimethoprim-polymyxin b (POLYTRIM) ophthalmic solution, Apply 1-2 drops into affected eye QID x 5 days., Disp: 10 mL, Rfl: 0   amitriptyline  (ELAVIL ) 10 MG tablet, Take 1 tablet (10 mg  total) by mouth at bedtime., Disp: 90 tablet, Rfl: 3   baclofen (LIORESAL) 10 MG tablet, Take 1 tablet (10 mg total) by mouth 3 (three) times daily., Disp: 30 each, Rfl: 0   diclofenac  Sodium (VOLTAREN ) 1 % GEL, Apply 4 g topically 4 (four) times daily as needed (Joint pain)., Disp: 100 g, Rfl: 3   esomeprazole (NEXIUM) 20 MG capsule, Take 20 mg by mouth daily., Disp: , Rfl:    meloxicam   (MOBIC ) 15 MG tablet, Take 1 tablet (15 mg total) by mouth daily., Disp: 90 tablet, Rfl: 3   pregabalin  (LYRICA ) 75 MG capsule, Take 1 capsule (75 mg total) by mouth 2 (two) times daily., Disp: 60 capsule, Rfl: 5  Observations/Objective: Patient is  in no acute distress.  No labored breathing. Speech is clear and coherent with logical content.  Patient is alert and oriented at baseline.   Video cut out before full examination so unable to fully visualize area of concern but the stye itself is visible of L upper eyelid just on general inspection at start of visit.   Assessment and Plan: 1. Hordeolum externum of left eye, unspecified eyelid (Primary) - trimethoprim-polymyxin b (POLYTRIM) ophthalmic solution; Apply 1-2 drops into affected eye QID x 5 days.  Dispense: 10 mL; Refill: 0  Supportive measures and OTC medications reviewed. Start warm compresses. Polytrim per orders. If not fully resolving, recommend follow-up with PCP for referral to ophthalmology for drainage/removal giving how long it has been present.  Follow Up Instructions: I discussed the assessment and treatment plan with the patient. The patient was provided an opportunity to ask questions and all were answered. The patient agreed with the plan and demonstrated an understanding of the instructions.  A copy of instructions were sent to the patient via MyChart unless otherwise noted below.   The patient was advised to call back or seek an in-person evaluation if the symptoms worsen or if the condition fails to improve as anticipated.    Jamie Velma Lunger, PA-C

## 2024-01-24 NOTE — Patient Instructions (Signed)
  Jamie Day, thank you for joining [name], PA-C for today's virtual visit.  While this provider is not your primary care provider (PCP), if your PCP is located in our provider database this encounter information will be shared with them immediately following your visit.   A [facility] MyChart account gives you access to today's visit and all your visits, tests, and labs performed at [facility]  click here if you don't have a [facility] MyChart account or go to [email or web address]  Consent: (Patient) Jamie Day provided verbal consent for this virtual visit at the beginning of the encounter.  Current Medications:  Current Outpatient Medications:    trimethoprim-polymyxin b (POLYTRIM) ophthalmic solution, Apply 1-2 drops into affected eye QID x 5 days., Disp: 10 mL, Rfl: 0   amitriptyline  (ELAVIL ) 10 MG tablet, Take 1 tablet (10 mg total) by mouth at bedtime., Disp: 90 tablet, Rfl: 3   baclofen (LIORESAL) 10 MG tablet, Take 1 tablet (10 mg total) by mouth 3 (three) times daily., Disp: 30 each, Rfl: 0   diclofenac  Sodium (VOLTAREN ) 1 % GEL, Apply 4 g topically 4 (four) times daily as needed (Joint pain)., Disp: 100 g, Rfl: 3   esomeprazole (NEXIUM) 20 MG capsule, Take 20 mg by mouth daily., Disp: , Rfl:    meloxicam  (MOBIC ) 15 MG tablet, Take 1 tablet (15 mg total) by mouth daily., Disp: 90 tablet, Rfl: 3   pregabalin  (LYRICA ) 75 MG capsule, Take 1 capsule (75 mg total) by mouth 2 (two) times daily., Disp: 60 capsule, Rfl: 5   Medications ordered in this encounter:  Meds ordered this encounter  Medications   trimethoprim-polymyxin b (POLYTRIM) ophthalmic solution    Sig: Apply 1-2 drops into affected eye QID x 5 days.    Dispense:  10 mL    Refill:  0    Supervising Provider:   BLAISE ALEENE KIDD [[id number]]     *If you need refills on other medications prior to your next appointment, please contact your pharmacy*  Follow-Up: Call back or seek an  in-person evaluation if the symptoms worsen or if the condition fails to improve as anticipated.  [facility] [phone]  Other Instructions Please keep hands washed.  Avoid touching the area. Apply warm compresses as directed. Use the drops as directed. If you note any non-resolving, new, or worsening symptoms despite treatment, please seek an in-person evaluation ASAP.    If you have been instructed to have an in-person evaluation today at a local Urgent Care facility, please use the link below. It will take you to a list of all of our available [facility], including address, phone number and hours of operation. Please do not delay care.  [facility]  If you or a family member do not have a primary care provider, use the link below to schedule a visit and establish care. When you choose a [facility] primary care physician or advanced practice provider, you gain a long-term partner in health. Find a Primary Care Provider  Learn more about 's in-office and virtual care options:  - Get Care Now

## 2024-02-13 ENCOUNTER — Encounter: Payer: Self-pay | Admitting: Radiology

## 2024-02-28 ENCOUNTER — Ambulatory Visit: Payer: Self-pay

## 2024-02-28 ENCOUNTER — Telehealth: Admitting: Emergency Medicine

## 2024-02-28 DIAGNOSIS — H00019 Hordeolum externum unspecified eye, unspecified eyelid: Secondary | ICD-10-CM

## 2024-02-28 NOTE — Patient Instructions (Signed)
  Jamie Day, thank you for joining Jon CHRISTELLA Belt, NP for today's virtual visit.  While this provider is not your primary care provider (PCP), if your PCP is located in our provider database this encounter information will be shared with them immediately following your visit.   A [facility] MyChart account gives you access to today's visit and all your visits, tests, and labs performed at [facility]  click here if you don't have a [facility] MyChart account or go to [email or web address]  Consent: (Patient) Jamie Day provided verbal consent for this virtual visit at the beginning of the encounter.  Current Medications:  Current Outpatient Medications:    amitriptyline  (ELAVIL ) 10 MG tablet, Take 1 tablet (10 mg total) by mouth at bedtime., Disp: 90 tablet, Rfl: 3   baclofen (LIORESAL) 10 MG tablet, Take 1 tablet (10 mg total) by mouth 3 (three) times daily., Disp: 30 each, Rfl: 0   diclofenac  Sodium (VOLTAREN ) 1 % GEL, Apply 4 g topically 4 (four) times daily as needed (Joint pain)., Disp: 100 g, Rfl: 3   esomeprazole (NEXIUM) 20 MG capsule, Take 20 mg by mouth daily., Disp: , Rfl:    meloxicam  (MOBIC ) 15 MG tablet, Take 1 tablet (15 mg total) by mouth daily., Disp: 90 tablet, Rfl: 3   pregabalin  (LYRICA ) 75 MG capsule, Take 1 capsule (75 mg total) by mouth 2 (two) times daily., Disp: 60 capsule, Rfl: 5   trimethoprim-polymyxin b (POLYTRIM) ophthalmic solution, Apply 1-2 drops into affected eye QID x 5 days., Disp: 10 mL, Rfl: 0   Medications ordered in this encounter:  No orders of the defined types were placed in this encounter.    *If you need refills on other medications prior to your next appointment, please contact your pharmacy*  Follow-Up: Call back or seek an in-person evaluation if the symptoms worsen or if the condition fails to improve as anticipated.  [facility] [phone]  Other Instructions  Call this eye doctor first  thing in the morning. Tell them you were seen by urgent care and that urgent care said you needed to see an eye doctor.   [facility] [address] [place]  [phone]    If you have been instructed to have an in-person evaluation today at a local Urgent Care facility, please use the link below. It will take you to a list of all of our available [facility], including address, phone number and hours of operation. Please do not delay care.  [facility]  If you or a family member do not have a primary care provider, use the link below to schedule a visit and establish care. When you choose a [facility] primary care physician or advanced practice provider, you gain a long-term partner in health. Find a Primary Care Provider  Learn more about [facility] in-office and virtual care options: [facility] - Get Care Now

## 2024-02-28 NOTE — Progress Notes (Signed)
 Virtual Visit Consent   Jamie Day, you are scheduled for a virtual visit with a [facility] provider today. Just as with appointments in the office, your consent must be obtained to participate. Your consent will be active for this visit and any virtual visit you may have with one of our providers in the next 365 days. If you have a MyChart account, a copy of this consent can be sent to you electronically.  As this is a virtual visit, video technology does not allow for your provider to perform a traditional examination. This may limit your provider's ability to fully assess your condition. If your provider identifies any concerns that need to be evaluated in person or the need to arrange testing (such as labs, EKG, etc.), we will make arrangements to do so. Although advances in technology are sophisticated, we cannot ensure that it will always work on either your end or our end. If the connection with a video visit is poor, the visit may have to be switched to a telephone visit. With either a video or telephone visit, we are not always able to ensure that we have a secure connection.  By engaging in this virtual visit, you consent to the provision of healthcare and authorize for your insurance to be billed (if applicable) for the services provided during this visit. Depending on your insurance coverage, you may receive a charge related to this service.  I need to obtain your verbal consent now. Are you willing to proceed with your visit today? Jamie Day has provided verbal consent on 02/28/2024 for a virtual visit (video or telephone). Jon CHRISTELLA Belt, NP  Date: 02/28/2024 6:29 PM   Virtual Visit via Video Note   I, Jon CHRISTELLA Belt, connected with  Jamie Day  ([id number], [birth year]) on 02/28/24 at  6:15 PM EST by a video-enabled telemedicine application and verified that I am speaking with the correct person using two identifiers. Unfortunately, Mr. Lienau video did not work. This  visit was conducted with my video on, audio only for Mr. Empie.   Location: Patient: Virtual Visit Location Patient: Home Provider: Virtual Visit Location Provider: Home Office   I discussed the limitations of evaluation and management by telemedicine and the availability of in person appointments. The patient expressed understanding and agreed to proceed.    History of Present Illness: Jamie Day is a 48 y.o. who identifies as a male who was assigned male at birth, and is being seen today for chronic stye. Has one that has finally started to improve after months but is still present. Has one on other eyelid that is newer. Has used creams and eye drops, warm compresses. Has not see an eye dr.   HPI: HPI  Problems:  Patient Active Problem List   Diagnosis Date Noted   Hypercalcemia 12/07/2023   Anxiety 12/07/2023   Chronic bilateral low back pain with bilateral sciatica 12/07/2023   Myofascial pain syndrome of lumbar spine 12/07/2023   Hyperglycemia 12/07/2023   Hypokalemia 12/07/2023   Post-traumatic osteoarthritis of multiple joints 12/07/2023   Hordeolum externum left upper eyelid 12/07/2023   Insomnia due to other mental disorder 12/07/2023   Tobacco use disorder, mild, abuse 12/07/2023    Allergies:  Allergies  Allergen Reactions   Duloxetine  Anxiety   Medications:  Current Outpatient Medications:    amitriptyline  (ELAVIL ) 10 MG tablet, Take 1 tablet (10 mg total) by mouth at bedtime., Disp: 90 tablet, Rfl: 3   baclofen (LIORESAL)  10 MG tablet, Take 1 tablet (10 mg total) by mouth 3 (three) times daily., Disp: 30 each, Rfl: 0   diclofenac  Sodium (VOLTAREN ) 1 % GEL, Apply 4 g topically 4 (four) times daily as needed (Joint pain)., Disp: 100 g, Rfl: 3   esomeprazole (NEXIUM) 20 MG capsule, Take 20 mg by mouth daily., Disp: , Rfl:    meloxicam  (MOBIC ) 15 MG tablet, Take 1 tablet (15 mg total) by mouth daily., Disp: 90 tablet, Rfl: 3   pregabalin  (LYRICA ) 75 MG capsule,  Take 1 capsule (75 mg total) by mouth 2 (two) times daily., Disp: 60 capsule, Rfl: 5   trimethoprim-polymyxin b (POLYTRIM) ophthalmic solution, Apply 1-2 drops into affected eye QID x 5 days., Disp: 10 mL, Rfl: 0  Observations/Objective: Patient is in no acute distress.  Resting comfortably  at home.  No labored breathing. overheard Speech is clear and coherent with logical content.  Patient is alert and oriented at baseline.    Assessment and Plan: 1. Hordeolum externum, unspecified laterality (Primary)  Given recurrent/chronic nature and that I cannot see (no video) I feel he should see ophthalmology  Follow Up Instructions: I discussed the assessment and treatment plan with the patient. The patient was provided an opportunity to ask questions and all were answered. The patient agreed with the plan and demonstrated an understanding of the instructions.  A copy of instructions were sent to the patient via MyChart unless otherwise noted below.   The patient was advised to call back or seek an in-person evaluation if the symptoms worsen or if the condition fails to improve as anticipated.    Jon CHRISTELLA Belt, NP

## 2024-02-28 NOTE — Telephone Encounter (Signed)
 FYI Only or Action Required?: scheduled virtual urgent care 02/28/2024  Patient was last seen in primary care on 01/24/2024 by Jamie Elsie BROCKS, PA-C.  Called Nurse Triage reporting Stye.  Symptoms began Wednesday.  Interventions attempted: Other: pt currently using old medication prescribed for previous stye.  Symptoms are: gradually worsening.  Triage Disposition: See PCP When Office is Open (Within 3 Days)  Patient/caregiver understands and will follow disposition?: Yes    Copied from CRM #[id number]. Topic: Clinical - Red Word Triage >> Feb 28, 2024 12:08 PM Eva FALCON wrote: Red Word that prompted transfer to Nurse Triage: States he has a stye on right eye, says its affecting vision, its blurry. Reason for Disposition  [1] After 5 days of treatment per [facility] Advice AND [2] not better  Answer Assessment - Initial Assessment Questions 1. LOCATION: Which eye has the sty? Upper or lower eyelid?     Right eye x 2  and already had sty on left eye 2. SIZE: How big is it? (Note: standard pencil eraser is 6 mm)     moderate 3. EYELID: Is the eyelid swollen? If Yes, ask: How much?     Moderate to severe 4. REDNESS: Has the redness spread onto the eyelid?     moderate 5. ONSET: When did you notice the sty?     Wednesday 6. VISION: Do you have blurred vision?     yes 7. PAIN: Is it painful? If Yes, ask: How bad is the pain?  (Scale 1-10; or mild, moderate, severe)     mild 8. CONTACTS: Do you wear contacts?     no 9. OTHER SYMPTOMS: Do you have any other symptoms? (e.g., fever)     no 10. PREGNANCY: Is there any chance you are pregnant? When was your last menstrual period?       no  Protocols used: Sty-A-AH

## 2024-03-07 ENCOUNTER — Ambulatory Visit: Admitting: Family Medicine

## 2024-03-14 ENCOUNTER — Ambulatory Visit: Admitting: Family Medicine

## 2024-03-14 NOTE — Progress Notes (Incomplete)
 Assessment & Plan   Assessment/Plan:    Problem List Items Addressed This Visit   None       Assessment and Plan               There are no discontinued medications.  No follow-ups on file.        Subjective:   Encounter date: 03/14/2024  Jamie Day is a 48 y.o. male who has Hypercalcemia; Anxiety; Chronic bilateral low back pain with bilateral sciatica; Myofascial pain syndrome of lumbar spine; Hyperglycemia; Hypokalemia; Post-traumatic osteoarthritis of multiple joints; Hordeolum externum left upper eyelid; Insomnia due to other mental disorder; and Tobacco use disorder, mild, abuse on their problem list..   He  has a past medical history of Acid reflux.SABRA   He presents with chief complaint of No chief complaint on file. .   Discussed the use of AI scribe software for clinical note transcription with the patient, who gave verbal consent to proceed.  History of Present Illness          Anxiety with Insomnia - Recently started on Amitriptyline  10 mg QHS and Pregabalin  75 mg BID. - Has previously tried duloxetine , which was discontinued due to adverse effects, including vivid dreams and increased anxiety.   Chronic Pain  - Pain contributed by lower back pain with sciatica, spondylitis, osteoarthritis, and chronic myofascial pain.  - For pain management uses meloxicam  50 mg oral daily, methocarbamol  500 mg oral three times daily, and Voltaren  gel for joint pain.    ROS  No past surgical history on file.  Current Outpatient Medications on File Prior to Visit  Medication Sig Dispense Refill   amitriptyline  (ELAVIL ) 10 MG tablet Take 1 tablet (10 mg total) by mouth at bedtime. 90 tablet 3   baclofen (LIORESAL) 10 MG tablet Take 1 tablet (10 mg total) by mouth 3 (three) times daily. 30 each 0   diclofenac  Sodium (VOLTAREN ) 1 % GEL Apply 4 g topically 4 (four) times daily as needed (Joint pain). 100 g 3   esomeprazole (NEXIUM) 20 MG capsule Take 20 mg  by mouth daily.     meloxicam  (MOBIC ) 15 MG tablet Take 1 tablet (15 mg total) by mouth daily. 90 tablet 3   pregabalin  (LYRICA ) 75 MG capsule Take 1 capsule (75 mg total) by mouth 2 (two) times daily. 60 capsule 5   trimethoprim -polymyxin b  (POLYTRIM ) ophthalmic solution Apply 1-2 drops into affected eye QID x 5 days. 10 mL 0   [DISCONTINUED] cetirizine (ZYRTEC) 10 MG tablet Take 10 mg by mouth daily.     [DISCONTINUED] fluticasone (FLONASE) 50 MCG/ACT nasal spray Place 2 sprays into both nostrils daily.     No current facility-administered medications on file prior to visit.    No family history on file.  Social History   Socioeconomic History   Marital status: Married    Spouse name: Not on file   Number of children: Not on file   Years of education: Not on file   Highest education level: Not on file  Occupational History   Not on file  Tobacco Use   Smoking status: Former    Current packs/day: 0.50    Average packs/day: 0.5 packs/day for 20.0 years (10.0 ttl pk-yrs)    Types: Cigarettes   Smokeless tobacco: Never   Tobacco comments:    Patient smoke 1 cigarette per day   Vaping Use   Vaping status: Never Used  Substance and Sexual Activity   Alcohol use:  No   Drug use: No   Sexual activity: Yes    Partners: Female    Birth control/protection: None  Other Topics Concern   Not on file  Social History Narrative   Married   1 son   Social Drivers of Corporate Investment Banker Strain: Not on file  Food Insecurity: Not on file  Transportation Needs: Not on file  Physical Activity: Not on file  Stress: Not on file  Social Connections: Not on file  Intimate Partner Violence: Not on file                                                                                                  Objective:  Physical Exam: There were no vitals taken for this visit.   Physical Exam           Physical Exam  MR LUMBAR SPINE WO CONTRAST Result Date: 12/28/2023 EXAM:  MRI LUMBAR SPINE 12/18/2023 08:28:38 AM TECHNIQUE: Multiplanar multisequence MRI of the lumbar spine was performed without the administration of intravenous contrast. COMPARISON: Lumbar spine series dated 07/30/2017. CLINICAL HISTORY: Low back pain, symptoms persist with > 6 wks treatment. CHRONIC LBP RAD BILAT LE; NKI, NO SX, NO INJECTIONS; NO PRIOR FINDINGS: BONES AND ALIGNMENT: Bilateral spondylolysis and grade 1 anterolisthesis at L5-S1. SPINAL CORD: The conus medullaris terminates at T12-L1. SOFT TISSUES: No paraspinal mass. L1-L2: No significant disc herniation. No spinal canal stenosis or neural foraminal narrowing. L2-L3: Diffuse disc bulging resulting in mild central spinal canal stenosis and moderate bilateral lateral recess stenosis. No apparent nerve root impingement. L3-L4: Diffuse disc bulging and bilateral facet hypertrophy with mild-to-moderate central spinal canal stenosis, moderate left lateral recess stenosis, and questionable impingement of the left L4 nerve. Mild-to-moderate right lateral recess and neural foraminal stenosis also present. L4-L5: Slight degenerative retrolisthesis. Mild-to-moderate bilateral lateral recess stenosis, but no apparent nerve root impingement. L5-S1: No significant disc herniation. Spinal canal and neural foramina are patent. IMPRESSION: 1. 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 2. Mild-to-moderate central spinal canal stenosis, moderate left lateral recess stenosis with questionable impingement of the left L4 nerve, and mild-to-moderate right lateral recess and neural foraminal stenosis at L3-4. 3. Mild-to-moderate bilateral lateral recess stenosis at L4-5. No apparent nerve root impingement. Electronically signed by: Evalene Coho MD 12/28/2023 10:01 AM EDT RP Workstation: [id number]    No results found for this or any previous visit (from the past 2160 hours).      Beverley KATHEE Hummer, MD  I,Emily Lagle,acting as a scribe for Beverley KATHEE Hummer, MD.,have documented all relevant documentation on the behalf of Beverley KATHEE Hummer, MD.  LILLETTE Beverley KATHEE Hummer, MD, have reviewed all documentation for this visit. The documentation on 03/14/2024 for the exam, diagnosis, procedures, and orders are all accurate and complete.
